# Patient Record
Sex: Male | Born: 1955 | Race: White | Hispanic: No | Marital: Single | Smoking: Former smoker
Health system: Southern US, Community
[De-identification: ages and names within clinical notes are randomized; demographics above are authoritative.]

## PROBLEM LIST (undated history)

## (undated) DIAGNOSIS — K5790 Diverticulosis of intestine, part unspecified, without perforation or abscess without bleeding: Secondary | ICD-10-CM

## (undated) DIAGNOSIS — K603 Anal fistula, unspecified: Secondary | ICD-10-CM

## (undated) DIAGNOSIS — K219 Gastro-esophageal reflux disease without esophagitis: Secondary | ICD-10-CM

## (undated) DIAGNOSIS — G4730 Sleep apnea, unspecified: Secondary | ICD-10-CM

## (undated) DIAGNOSIS — L509 Urticaria, unspecified: Secondary | ICD-10-CM

## (undated) DIAGNOSIS — K649 Unspecified hemorrhoids: Secondary | ICD-10-CM

## (undated) DIAGNOSIS — R569 Unspecified convulsions: Secondary | ICD-10-CM

## (undated) HISTORY — PX: COLONOSCOPY W/ POLYPECTOMY: SHX1380

## (undated) HISTORY — DX: Unspecified convulsions: R56.9

## (undated) HISTORY — PX: NOSE SURGERY: SHX723

## (undated) HISTORY — DX: Sleep apnea, unspecified: G47.30

## (undated) HISTORY — DX: Anal fistula, unspecified: K60.30

## (undated) HISTORY — DX: Anal fistula: K60.3

## (undated) HISTORY — DX: Gastro-esophageal reflux disease without esophagitis: K21.9

## (undated) HISTORY — DX: Unspecified hemorrhoids: K64.9

## (undated) HISTORY — DX: Diverticulosis of intestine, part unspecified, without perforation or abscess without bleeding: K57.90

## (undated) HISTORY — DX: Urticaria, unspecified: L50.9

## (undated) NOTE — OR Surgeon (Signed)
 Formatting of this note is different from the original.  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 Right nasal polyp.  Estimated Blood Loss:    10 ml  Fluids:    Crystalloid: 600 ml  Drains:   none  Complications: * No complications entered in OR log *  Specimens:  Specimen ID Type Site Comments Sent To  x Culture Aerobic  Source: Nasal Septum   x Culture Fungal  Source: Nasal Septum   x Other  AFB stain/culture   [id number] Permanent with Preservative  1) RIGHT NASAL POLYP   [id number] Permanent with Preservative  2) POSTERIOR NASAL SEPTUM   [id number] Permanent with Preservative   3) SUPERIOR NASAL SEPTUM     Condition: stable  Disposition: PACU  Dictation: Yes   Electronically signed by Bonnee Reatha MOHR (M.D.) at 10/23/2011  9:15 AM PDT

## (undated) NOTE — OR PreOp (Signed)
 Formatting of this note is different from the original. PreOperative Telephone History Supervising Physician: Dr. 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 Per progress note by Bonnee Reatha MOHR (M.D.) 09/24/2011 5:55 PM Signed  Chief complaint: Epistaxis.  History of present illness: 48 year old man with severe abscess requiring bilateral packing one week ago. On the patient's packing was removed the following day and Surgicel was placed. The patient was asked to followup with ear nose and throat. The patient also complains of decreased hearing bilaterally since air flight with nasal packing in place.  Diagnostic nasal endoscopy was performed after suctioning some mucoid and bloody drainage from the right nostril. There is inflammation of and probable persistent packing material. There appears to be a possible polypoid mass in the right posterior nasal fossa. This is not clear and may actually be persistent packing material or clots. There are bilateral eschars on the nasal septum consistent with silver nitrate cautery.  Impression plan: Recent epistaxis. It resolved. Possible right nasal mass versus a persistent packing material and clot. Use saline spray. Return to clinic 10 days. A biopsy anything abnormal at that point. A repeat nasal endoscopy at that time. Nasopharyngoscopy at that time as well. Otitis media diffusion is probably reactive to the recent packing material and air flight.  Past Medical History Past Medical History   Diagnosis Date  ? ERECTILE DYSFUNCTION 11/22/2010   Past Surgical History  Procedure Date  ? Negative past surgical hx 10/18/11   Allergies  Allergen Reactions  ? No Known Allergies    Outpatient Prescriptions Marked as Taking for the 10/23/11 encounter [facility] Encounter)  Medication Sig  ? Cyanocobalamin (VITAMIN B-12) 250 mcg Oral Tab Daily   Social History:   History  Smoking status  ? Current Everyday Smoker -- 1.0 packs/day  ? Types: Cigarettes  Smokeless tobacco  ? Never Used  Comment: quit 35 days ago today's date 10/18/11   History  Alcohol Use  ? Yes    Has not drank since 09/15/11   History  Drug Use No   Family History of Anesthesia Complications: None  Personal History of Anesthesia Complications: none  Review of Systems:  General: Denies fever, chills, weight change, fatigue, weakness and but does have the following: season allergies.  Cardiovascular: Denies chest pain, orthopnea, PND and palpitations with exercise or at rest Respiratory: Denies recent SOB, orthopnea, dyspnea and cough (productive) Neuro: Denies fainting, seizures, weakness of extremities, numbness and headaches GI: Denies GERD GU: Denies dysuria, frequency, urgency and history of difficult foley catheter placement. Heme: Denies easy bruising and bleeding Musculoskeletal: Denies joint pain and decreased ROM neck Skin: Denies rashes, abrasions, cuts, skin infection and skin changes over the surgical site Other: none  Physical Exam: To be completed on the day of surgery.  Most recent information from chart review: Estimated Body mass index is 27.98 kg/(m^2) as calculated from the following:   Height as of 09/24/11: 5' 10(1.778 m).   Weight as of 09/24/11: 195 lb(88.451 kg). SpO2 Readings from Last 3 Encounters:  09/24/11 100%  12/18/10 97%  09/25/10 97%   BP Readings from Last 3 Encounters:  09/24/11 121/76  12/18/10 130/85  09/25/10 125/84   Pulse Readings from Last 3  Encounters:  09/24/11 116  12/18/10 72  09/25/10 80   Pre-Operative Screening  Pregnancy Testing:  N/A (male, nonmenstruating male, hx of hysterectomy)  Obstructive Sleep Apnea Criteria: Risk factors or symptoms of sleep apnea as follows:  Snoring  Exercise/Functional Capacity:  7-8 mets: e.g. Heavy housework (scrubbing floors, moving furniture), participate in activities such as golf, bowling, dancing, doubles tennis and Patient's current level of activity includes the following: bikes.   History of Cardiac Stent: No   Objections To Blood Transfusions: NO  Labs:   Lab Results  Component Value Date/Time   WBC 6.3 09/26/2010  7:26 AM   HCT 43.3 09/26/2010  7:26 AM   PLT 236 09/26/2010  7:26 AM   Lab Results  Component Value Date/Time   CR 1.19 09/26/2010  7:26 AM   No results found for this basename: PT, PTT, INR   Lab Results  Component Value Date/Time   TSH 1.50 09/26/2010  7:26 AM   Review of Other Relevant Data: EKG: date 10/18/11, result Other wise normal EKG Sinus rhythm Abnormal R-wave progression, early transition Cardiac rate 94  Echo: none  Cardiac Stress Test: none  Pneumonia Prevention Education: N/A, hospital ambulatory surgery  Impression: [name] is a 29 Y male is prepared for planned surgery.  The following studies have been ordered or are pending at the time of this visit: CT  Patient Instructions: The patient was provided the following PREOPERATIVE instructions:  Prior to the Day of Surgery  Discontinue all herbal supplements and vitamins at least 7 days prior to surgery.  On the Day of Surgery Take only the following meds with small sips of water:nothing.  Patient instructed to wear button up shirt the morning of surgery.  Asberry CANDIE Savory, PA-C   Electronically signed by Bonnee Reatha MOHR (M.D.) at 10/18/2011  3:54 PM PDT

## (undated) NOTE — H&P (Signed)
 Formatting of this note is different from the original. 10/23/2011                 HISTORY AND PHYSICAL EXAMINATION Elijah Robertson  [id number]  RR:wjdjo lesion YEP:Izdumlrupcz nasal lesion. Recent history of epistaxis requiring packing. Septum has been destroyed after bilateral eschar formation. PMH:  Past Medical History  Diagnosis Date  ? ERECTILE DYSFUNCTION 11/22/2010  ? NASAL LESION. 10/23/2011   PSH:   Past Surgical History  Procedure Date  ? Negative past surgical hx 10/18/11    Allergies Allergies  Allergen Reactions  ? No Known Allergies    Medications Current Facility-Administered Medications  Medication  ? Lidocaine  10 mg/mL (1 %) Inj 1 mg (Xylocaine )  ? Lactated Ringers  IV Premix  ? Midazolam  Inj 1 mg (VERSED )  ? Clindamycin in D5W IV Premix 900 mg (CLEOCIN)   No current outpatient prescriptions on file.   Social History History  Smoking status  ? Current Everyday Smoker -- 1.0 packs/day  ? Types: Cigarettes  Smokeless tobacco  ? Never Used  Comment: quit 35 days ago today's date 10/18/11   History  Alcohol Use  ? Yes    Has not drank since 09/15/11   History  Drug Use No   Family History: No problems with GA or bleeding disorders.  Review of Systems: Neuro: no symptoms Cardiovascular: No chest pain, shortness of breath. Respiratory: No cough, wheezing. GI: No abdominal pain, nausea, vomiting, diarrhea, melena, hematochezia. GU: NO dysuria. No back pain  BP 117/82  Pulse 73  Temp 97.8 F (36.6 C)  Resp 20  Ht 5' 10  Wt 195 lb (88.451 kg)  BMI 27.98 kg/m2  SpO2 99% General: Well-developed, well nourished male appearing stated age. Neuro: CN II-XII grossly intact and symmetric. No gross sensory or strength deficits. HEENT: Normocephalic. TM's intact and mobile. Normal  oc/op exam. Crusting and devitalized tissue in nose. Neck: supple without adenopathy, thyromegaly.  Chest: Breath sounds equal and clear to auscultation. Cor: RRR  without murmur, gallop, rub. Abdomen: soft, nontender, nondistended. no hepatoslenomegaly. No fluid. Extremities: No clubbing, cyanosis, edema. Good peripheral pulses. Warm, dry. Skin: NO lesions. Back: No CVA tenderness. GU/Rectal: not performed. Breasts: not performed.  Impression/Plan:Destructive process in nose. Offered debridement and biopsy. We discussed the risks, benefits, and alternatives to the procedure and plan of care including potential side effects and complications, both of which may be severe and require additional surgery and/or procedures to treat this.  We discussed the risks and benefits of alternative treatment options and the likelihood of achieving the desired outcome with surgery.  The patient was given the opportunity to ask questions and to acknowledge understanding of the procedure and any potential problems that might occur during recuperation.  BASIL W. ADAD MD   Electronically signed by Bonnee Reatha MOHR (M.D.) at 10/23/2011  7:41 AM PDT

## (undated) NOTE — Progress Notes (Signed)
 Formatting of this note might be different from the original. Ordered anca to be obtained today. Plan on postoperative diflucan  with all results pending and continued destructive process. Electronically signed by Bonnee Reatha MOHR (M.D.) at 10/23/2011  7:50 AM PDT

## (undated) NOTE — Nursing Note (Signed)
 Formatting of this note might be different from the original. PreOp Nursing Note  Handoff: Handoff completed to OR RN. The following are on the chart (H&P, Consent, Interval, RBA)   Medications/Orders: All preop medications administered as ordered.  SCDs  on patient as ordered.  Pain: Patient and/or family educated on pain scale, expected pain and pain management strategies.  Fall Assessment: All perioperative patients are considered high risk for fall related to sedation & anesthesia.  Exceptions: Listed below are exceptions to above: pt admitted for nasal surgery. Talked to doctor and anesthesia. All questions answered, all procedures explained. Pt appears calm. Sister at bedside visiting. Ready for surgery. Well prepared.  Electronically signed by Frizzell, Patrice E (R.N.) at 10/23/2011  7:48 AM PDT

## (undated) NOTE — Discharge Summary (Signed)
 Formatting of this note is different from the original.  Discharge Note - [facility] / [facility]  Procedure Performed: Procedure(s) (LRB): NASAL INTERNAL BIOPSY OF LESION (Bilateral)  Date of Procedure: 10/23/2011  Hospital Course:  No Complications  Discharge Instructions:    Entered in [facility]  Discharge Medications:  Current Discharge Medication List   START taking these medications   Details  HYDROcodone-Acetaminophen  (NORCO) 10-325 mg Oral Tab TAKE 1 TABLET ORALLY EVERY 4 HOURS AS NEEDED FOR PAIN   Fluconazole  (DIFLUCAN ) 100 mg Oral Tab Take 2 tablets per day and one tablet daily by mouth    CONTINUE these medications which have NOT CHANGED   Details  Cyanocobalamin (VITAMIN B-12) 250 mcg Oral Tab Daily   HYDROcodone-Acetaminophen  (NORCO) 5-325 mg Oral Tab Take 1 to 2 tablets orally every 4 to 6 hours when needed for pain     Condition on Discharge:   Currently in PACU.  Will be discharged if stable and discharge criteria met.   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

---
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
## 2007-06-12 DIAGNOSIS — Z87891 Personal history of nicotine dependence: Secondary | ICD-10-CM | POA: Insufficient documentation

## 2011-12-10 DIAGNOSIS — J3489 Other specified disorders of nose and nasal sinuses: Secondary | ICD-10-CM | POA: Insufficient documentation

## 2013-12-01 DIAGNOSIS — Z9889 Other specified postprocedural states: Secondary | ICD-10-CM | POA: Insufficient documentation

## 2013-12-01 DIAGNOSIS — Z86010 Personal history of colonic polyps: Secondary | ICD-10-CM | POA: Insufficient documentation

## 2014-01-14 DIAGNOSIS — K649 Unspecified hemorrhoids: Secondary | ICD-10-CM | POA: Insufficient documentation

## 2015-11-13 DIAGNOSIS — Z860100 Personal history of colon polyps, unspecified: Secondary | ICD-10-CM | POA: Insufficient documentation

## 2016-09-11 ENCOUNTER — Encounter: Payer: Self-pay | Admitting: Family

## 2016-09-11 ENCOUNTER — Ambulatory Visit (INDEPENDENT_AMBULATORY_CARE_PROVIDER_SITE_OTHER): Payer: Managed Care, Other (non HMO) | Admitting: Family

## 2016-09-11 VITALS — BP 162/88 | HR 92 | Temp 97.6°F | Resp 16 | Ht 69.0 in | Wt 215.0 lb

## 2016-09-11 DIAGNOSIS — B353 Tinea pedis: Secondary | ICD-10-CM | POA: Insufficient documentation

## 2016-09-11 DIAGNOSIS — K635 Polyp of colon: Secondary | ICD-10-CM | POA: Diagnosis not present

## 2016-09-11 DIAGNOSIS — R0683 Snoring: Secondary | ICD-10-CM

## 2016-09-11 DIAGNOSIS — E6609 Other obesity due to excess calories: Secondary | ICD-10-CM

## 2016-09-11 DIAGNOSIS — N521 Erectile dysfunction due to diseases classified elsewhere: Secondary | ICD-10-CM

## 2016-09-11 DIAGNOSIS — R030 Elevated blood-pressure reading, without diagnosis of hypertension: Secondary | ICD-10-CM

## 2016-09-11 DIAGNOSIS — E669 Obesity, unspecified: Secondary | ICD-10-CM | POA: Insufficient documentation

## 2016-09-11 DIAGNOSIS — Z6831 Body mass index (BMI) 31.0-31.9, adult: Secondary | ICD-10-CM

## 2016-09-11 DIAGNOSIS — L740 Miliaria rubra: Secondary | ICD-10-CM

## 2016-09-11 MED ORDER — KETOCONAZOLE 2 % EX CREA: 1.0000 "application " | TOPICAL_CREAM | Freq: Every day | CUTANEOUS | 2 refills | Status: DC

## 2016-09-11 MED ORDER — FLUCONAZOLE 150 MG PO TABS: ORAL_TABLET | ORAL | 0 refills | Status: DC

## 2016-09-11 MED ORDER — SILDENAFIL CITRATE 20 MG PO TABS: ORAL_TABLET | ORAL | 1 refills | Status: DC

## 2016-09-11 NOTE — Assessment & Plan Note (Signed)
Elevated blood pressure reading with no significant history of hypertension. Encouraged to monitor blood pressure at home and follow-up in one month for blood pressure checks. Recommended low-sodium diet and increasing physical activity. Denies worst headache of life with no symptoms of end organ damage noted on physical exam.

## 2016-09-11 NOTE — Assessment & Plan Note (Signed)
No significant findings on physical exam was snoring with concern for possible sleep apnea with Epworth Sleepiness Scale of 8. Refer to neurology/sleep medicine for possible sleep study. Consider trial of Breathe Right strips with recommendation of weight loss to help with symptom reduction.

## 2016-09-11 NOTE — Progress Notes (Signed)
Subjective:    Patient ID: Elijah Robertson, male    DOB: 1955/08/11, 61 y.o.   MRN: [id number]  Chief Complaint  Patient presents with  . Establish Care    rashes that break out only when he sweats or works out that he notices, they come and go for years    HPI:  Elijah Robertson is a 61 y.o. male who  has a past medical history of Hemorrhoids and Seizures ([place]). and presents today for an office visit to establish.  1.)  Rashes - This is a new problem. Associated symptoms of a rash located in several areas including feet, chest and head. Described as red. Notes that they get worse with sweating and tends to improve slightly when he cools down. Has noted some spreading since initial onset. This has been waxing and waning over the course of the past couple of years.    2.) Colon cancer screening - Completed colonscopy about 3 years ago and was recommended to follow up in 3 years for additional screening secondary to colon polyps that he believes were to benign. Newly moved to the area and due for colonoscopy. He does have hemorrhoids, but otherwise denies melena, constipation or diarrhea.   3.) Erectile dysfunction - Currently maintained on sildenafil. Reports taking the medication as prescribed and denies adverse side effects or priapism.     4.) Snorning - This is a new problem. Associated symptoms of snorning has been going on for several years. Denies witnessned periods of apena. Does feel well rested in the morning with occasional headache that gets better over the course of the day. Denies hypersolmulence throughout the day. Modifying factors include working out and attempting to lose weight.   No Known Allergies    No outpatient prescriptions prior to visit.   No facility-administered medications prior to visit.      Past Medical History:  Diagnosis Date  . Hemorrhoids   . Seizures ([place])    Childhood - none in adulthood      Past Surgical History:  Procedure  Laterality Date  . NOSE SURGERY     Septal surgery      Family History  Problem Relation Age of Onset  . Breast cancer Mother   . Healthy Maternal Grandmother   . Healthy Maternal Grandfather   . Healthy Paternal Grandmother       Social History   Social History  . Marital status: Single    Spouse name: N/A  . Number of children: N/A  . Years of education: N/A   Occupational History  . Not on file.   Social History Main Topics  . Smoking status: Former Research scientist (life sciences)  . Smokeless tobacco: Never Used  . Alcohol use Yes     Comment: several drinks per week.   . Drug use: Yes    Types: Marijuana     Comment: 1x every couple of months  . Sexual activity: Yes   Other Topics Concern  . Not on file   Social History Narrative  . No narrative on file      Review of Systems  Constitutional: Negative for chills and fever.  Respiratory: Negative for chest tightness, shortness of breath and wheezing.   Cardiovascular: Negative for chest pain, palpitations and leg swelling.  Genitourinary:       Positive for erectile dysfunction  Skin: Positive for rash.  Psychiatric/Behavioral: Negative for sleep disturbance.       Objective:    BP (!) 162/88 (BP Location:  Left Arm, Patient Position: Sitting, Cuff Size: Large)   Pulse 92   Temp 97.6 F (36.4 C) (Oral)   Resp 16   Ht 5\' 9"  (1.753 m)   Wt 215 lb (97.5 kg)   SpO2 95%   BMI 31.75 kg/m  Nursing note and vital signs reviewed.  Physical Exam  Constitutional: He is oriented to person, place, and time. He appears well-developed and well-nourished. No distress.  HENT:  Nose: Nose normal. No mucosal edema, rhinorrhea, nose lacerations, sinus tenderness, nasal deformity, septal deviation or nasal septal hematoma. No epistaxis.  No foreign bodies.  Cardiovascular: Normal rate, regular rhythm, normal heart sounds and intact distal pulses.   Pulmonary/Chest: Effort normal and breath sounds normal.  Neurological: He is alert  and oriented to person, place, and time.  Skin: Skin is warm and dry.  Right foot with red rash located between second and third and third and fourth toes with mild skin breakdown. No obvious odor or discharge present.  Head/chest - no significant rash noted at this time.  Psychiatric: He has a normal mood and affect. His behavior is normal. Judgment and thought content normal.        Assessment & Plan:   Problem List Items Addressed This Visit      Digestive   Polyp of colon    Previously diagnosed with polyps during his most recent colonoscopy approximately 3 years ago with recommendation for follow-up colonoscopy in 3 years. Referral to gastroenterology placed for colonoscopy follow-up.      Relevant Orders   Ambulatory referral to Gastroenterology     Musculoskeletal and Integument   Heat rash - Primary    Rash description is consistent with possible heat rash most likely related to sweating. Recommend cotton clothing and over-the-counter hydrocortisone cream as needed. If symptoms worsen or do not improve consider referral to dermatology.      Tinea pedis of right foot    Symptoms and exam consistent with tinea pedis of the right foot. Continue current dosage of ketoconazole and start fluconazole. Prevention care discussed. Follow-up if symptoms worsen or do not improve.      Relevant Medications   ketoconazole (NIZORAL) 2 % cream   fluconazole (DIFLUCAN) 150 MG tablet     Other   Erectile dysfunction due to diseases classified elsewhere    Stable and currently maintained on sildenafil with no adverse side effects or priapism. Continue current dosage of sildenafil. Continue to monitor.      Snoring    No significant findings on physical exam was snoring with concern for possible sleep apnea with Epworth Sleepiness Scale of 8. Refer to neurology/sleep medicine for possible sleep study. Consider trial of Breathe Right strips with recommendation of weight loss to help with  symptom reduction.      Relevant Orders   Ambulatory referral to Neurology   Elevated blood pressure reading    Elevated blood pressure reading with no significant history of hypertension. Encouraged to monitor blood pressure at home and follow-up in one month for blood pressure checks. Recommended low-sodium diet and increasing physical activity. Denies worst headache of life with no symptoms of end organ damage noted on physical exam.      Obesity    BMI of 31.75. Recommend weight loss of 5-10% of current body weight. Recommend increasing physical activity to 30 minutes of moderate level activity daily. Encourage nutritional intake that focuses on nutrient dense foods and is moderate, varied, and balanced and is low in saturated  fats and processed/sugary foods. Continue to monitor.            I have changed Elijah Robertson's sildenafil. I am also having him start on fluconazole. Additionally, I am having him maintain his Zinc Sulfate (ZINC 15 PO), acetaminophen, and ketoconazole.   Meds ordered this encounter  Medications  . DISCONTD: sildenafil (REVATIO) 20 MG tablet    Sig: Take 20 mg by mouth 3 (three) times daily.  Marland Kitchen DISCONTD: ketoconazole (NIZORAL) 2 % cream    Sig: Apply 1 application topically daily.  . Zinc Sulfate (ZINC 15 PO)    Sig: Take by mouth.  Marland Kitchen acetaminophen (TYLENOL) 325 MG tablet    Sig: Take 650 mg by mouth every 6 (six) hours as needed.  Marland Kitchen ketoconazole (NIZORAL) 2 % cream    Sig: Apply 1 application topically daily.    Dispense:  15 g    Refill:  2    Order Specific Question:   Supervising Provider    Answer:   Pricilla Holm A [id number]  . sildenafil (REVATIO) 20 MG tablet    Sig: Take 1-5 tablets by mouth daily as needed for erectile dysfunction.    Dispense:  50 tablet    Refill:  1    Order Specific Question:   Supervising Provider    Answer:   Pricilla Holm A [id number]  . fluconazole (DIFLUCAN) 150 MG tablet    Sig: Take 1 tablet by mouth  weekly.    Dispense:  2 tablet    Refill:  0    Order Specific Question:   Supervising Provider    Answer:   Pricilla Holm A [id number]     Follow-up: Return in about 1 month (around 10/09/2016), or if symptoms worsen or fail to improve.  Mauricio Po, FNP

## 2016-09-11 NOTE — Assessment & Plan Note (Signed)
Stable and currently maintained on sildenafil with no adverse side effects or priapism. Continue current dosage of sildenafil. Continue to monitor.

## 2016-09-11 NOTE — Assessment & Plan Note (Signed)
Symptoms and exam consistent with tinea pedis of the right foot. Continue current dosage of ketoconazole and start fluconazole. Prevention care discussed. Follow-up if symptoms worsen or do not improve.

## 2016-09-11 NOTE — Assessment & Plan Note (Signed)
Rash description is consistent with possible heat rash most likely related to sweating. Recommend cotton clothing and over-the-counter hydrocortisone cream as needed. If symptoms worsen or do not improve consider referral to dermatology.

## 2016-09-11 NOTE — Assessment & Plan Note (Signed)
BMI of 31.75. Recommend weight loss of 5-10% of current body weight. Recommend increasing physical activity to 30 minutes of moderate level activity daily. Encourage nutritional intake that focuses on nutrient dense foods and is moderate, varied, and balanced and is low in saturated fats and processed/sugary foods. Continue to monitor.

## 2016-09-11 NOTE — Assessment & Plan Note (Signed)
Previously diagnosed with polyps during his most recent colonoscopy approximately 3 years ago with recommendation for follow-up colonoscopy in 3 years. Referral to gastroenterology placed for colonoscopy follow-up.

## 2016-09-11 NOTE — Patient Instructions (Addendum)
Thank you for choosing [facility].  SUMMARY AND INSTRUCTIONS:  Wear cotton type clothing.  To reduce sweating, consider an antiperspirant to the feet (white solid not gel or spray)  Consider a antifungal powder as needed such as Goldbond.  Medication:  Your prescription(s) have been submitted to your pharmacy or been printed and provided for you. Please take as directed and contact our office if you believe you are having problem(s) with the medication(s) or have any questions.  Referrals:  Referrals have been made during this visit. You should expect to hear back from our schedulers in about 7-10 days in regards to establishing an appointment with the specialists we discussed.   Follow up:  If your symptoms worsen or fail to improve, please contact our office for further instruction, or in case of emergency go directly to the emergency room at the closest medical facility.

## 2016-09-26 ENCOUNTER — Encounter: Payer: Self-pay | Admitting: Gastroenterology

## 2016-10-28 ENCOUNTER — Encounter: Payer: Self-pay | Admitting: Family

## 2016-10-28 ENCOUNTER — Other Ambulatory Visit (INDEPENDENT_AMBULATORY_CARE_PROVIDER_SITE_OTHER): Payer: 59

## 2016-10-28 ENCOUNTER — Ambulatory Visit (INDEPENDENT_AMBULATORY_CARE_PROVIDER_SITE_OTHER): Payer: 59 | Admitting: Family

## 2016-10-28 VITALS — BP 140/84 | HR 78 | Temp 98.1°F | Resp 16 | Ht 69.0 in | Wt 212.0 lb

## 2016-10-28 DIAGNOSIS — Z6831 Body mass index (BMI) 31.0-31.9, adult: Secondary | ICD-10-CM | POA: Diagnosis not present

## 2016-10-28 DIAGNOSIS — Z0000 Encounter for general adult medical examination without abnormal findings: Secondary | ICD-10-CM

## 2016-10-28 DIAGNOSIS — Z7289 Other problems related to lifestyle: Secondary | ICD-10-CM

## 2016-10-28 DIAGNOSIS — E6609 Other obesity due to excess calories: Secondary | ICD-10-CM

## 2016-10-28 LAB — LIPID PANEL
Cholesterol: 221 mg/dL — ABNORMAL HIGH (ref 0–200)
HDL: 48.6 mg/dL (ref >39.00)
LDL CALC: 136 mg/dL — AB (ref 0–99)
NonHDL: 172.21
Total CHOL/HDL Ratio: 5
Triglycerides: 183 mg/dL — ABNORMAL HIGH (ref 0.0–149.0)
VLDL: 36.6 mg/dL (ref 0.0–40.0)

## 2016-10-28 LAB — COMPREHENSIVE METABOLIC PANEL
ALBUMIN: 4.2 g/dL (ref 3.5–5.2)
ALK PHOS: 53 U/L (ref 39–117)
ALT: 45 U/L (ref 0–53)
AST: 55 U/L — AB (ref 0–37)
BUN: 22 mg/dL (ref 6–23)
CHLORIDE: 106 meq/L (ref 96–112)
CO2: 29 mEq/L (ref 19–32)
CREATININE: 1.13 mg/dL (ref 0.40–1.50)
Calcium: 9.7 mg/dL (ref 8.4–10.5)
GFR: 70.66 mL/min (ref >60.00)
Glucose, Bld: 99 mg/dL (ref 70–99)
Potassium: 4.9 mEq/L (ref 3.5–5.1)
SODIUM: 140 meq/L (ref 135–145)
TOTAL PROTEIN: 6.9 g/dL (ref 6.0–8.3)
Total Bilirubin: 0.3 mg/dL (ref 0.2–1.2)

## 2016-10-28 LAB — CBC
HCT: 44.4 % (ref 39.0–52.0)
Hemoglobin: 15.2 g/dL (ref 13.0–17.0)
MCHC: 34.2 g/dL (ref 30.0–36.0)
MCV: 92.5 fl (ref 78.0–100.0)
Platelets: 259 10*3/uL (ref 150.0–400.0)
RBC: 4.79 Mil/uL (ref 4.22–5.81)
RDW: 13.2 % (ref 11.5–15.5)
WBC: 5.7 10*3/uL (ref 4.0–10.5)

## 2016-10-28 LAB — PSA: PSA: 3.47 ng/mL (ref 0.10–4.00)

## 2016-10-28 NOTE — Assessment & Plan Note (Signed)
BMI of 31. Recommend weight loss of 5-10% of current body weight. Recommend increasing physical activity to 30 minutes of moderate level activity daily. Encourage nutritional intake that focuses on nutrient dense foods and is moderate, varied, and balanced and is low in saturated fats and processed/sugary foods. Continue to monitor.

## 2016-10-28 NOTE — Patient Instructions (Addendum)
Thank you for choosing [facility].  SUMMARY AND INSTRUCTIONS:  Debrox or Murine for cleaning your ears available over the counter.   Use MyFitnessPal to track your calories or other software/application.  Goal to lose about 5-10% of your current body weight.   They will call to schedule your appointment for dermatology.  Labs:  Please stop by the lab on the lower level of the building for your blood work. Your results will be released to [facility] (or called to you) after review, usually within 72 hours after test completion. If any changes need to be made, you will be notified at that same time.  1.) The lab is open from 7:30am to 5:30 pm Monday-Friday 2.) No appointment is necessary 3.) Fasting (if needed) is 6-8 hours after food and drink; black coffee and water are okay    Follow up:  If your symptoms worsen or fail to improve, please contact our office for further instruction, or in case of emergency go directly to the emergency room at the closest medical facility.    Health Maintenance, Male A healthy lifestyle and preventive care is important for your health and wellness. Ask your health care provider about what schedule of regular examinations is right for you. What should I know about weight and diet?  Eat a Healthy Diet  Eat plenty of vegetables, fruits, whole grains, low-fat dairy products, and lean protein.  Do not eat a lot of foods high in solid fats, added sugars, or salt. Maintain a Healthy Weight  Regular exercise can help you achieve or maintain a healthy weight. You should:  Do at least 150 minutes of exercise each week. The exercise should increase your heart rate and make you sweat (moderate-intensity exercise).  Do strength-training exercises at least twice a week. Watch Your Levels of Cholesterol and Blood Lipids  Have your blood tested for lipids and cholesterol every 5 years starting at 59 years of age. If you are at high risk for heart  disease, you should start having your blood tested when you are 59 years old. You may need to have your cholesterol levels checked more often if:  Your lipid or cholesterol levels are high.  You are older than 59 years of age.  You are at high risk for heart disease. What should I know about cancer screening? Many types of cancers can be detected early and may often be prevented. Lung Cancer  You should be screened every year for lung cancer if:  You are a current smoker who has smoked for at least 30 years.  You are a former smoker who has quit within the past 15 years.  Talk to your health care provider about your screening options, when you should start screening, and how often you should be screened. Colorectal Cancer  Routine colorectal cancer screening usually begins at 59 years of age and should be repeated every 5-10 years until you are 59 years old. You may need to be screened more often if early forms of precancerous polyps or small growths are found. Your health care provider may recommend screening at an earlier age if you have risk factors for colon cancer.  Your health care provider may recommend using home test kits to check for hidden blood in the stool.  A small camera at the end of a tube can be used to examine your colon (sigmoidoscopy or colonoscopy). This checks for the earliest forms of colorectal cancer. Prostate and Testicular Cancer  Depending on your age  and overall health, your health care provider may do certain tests to screen for prostate and testicular cancer.  Talk to your health care provider about any symptoms or concerns you have about testicular or prostate cancer. Skin Cancer  Check your skin from head to toe regularly.  Tell your health care provider about any new moles or changes in moles, especially if:  There is a change in a mole's size, shape, or color.  You have a mole that is larger than a pencil eraser.  Always use sunscreen. Apply  sunscreen liberally and repeat throughout the day.  Protect yourself by wearing long sleeves, pants, a wide-brimmed hat, and sunglasses when outside. What should I know about heart disease, diabetes, and high blood pressure?  If you are 51-76 years of age, have your blood pressure checked every 3-5 years. If you are 67 years of age or older, have your blood pressure checked every year. You should have your blood pressure measured twice-once when you are at a hospital or clinic, and once when you are not at a hospital or clinic. Record the average of the two measurements. To check your blood pressure when you are not at a hospital or clinic, you can use:  An automated blood pressure machine at a pharmacy.  A home blood pressure monitor.  Talk to your health care provider about your target blood pressure.  If you are between 1-59 years old, ask your health care provider if you should take aspirin to prevent heart disease.  Have regular diabetes screenings by checking your fasting blood sugar level.  If you are at a normal weight and have a low risk for diabetes, have this test once every three years after the age of 69.  If you are overweight and have a high risk for diabetes, consider being tested at a younger age or more often.  A one-time screening for abdominal aortic aneurysm (AAA) by ultrasound is recommended for men aged 65-75 years who are current or former smokers. What should I know about preventing infection? Hepatitis B  If you have a higher risk for hepatitis B, you should be screened for this virus. Talk with your health care provider to find out if you are at risk for hepatitis B infection. Hepatitis C  Blood testing is recommended for:  Everyone born from [birth year] through 1963.  Anyone with known risk factors for hepatitis C. Sexually Transmitted Diseases (STDs)  You should be screened each year for STDs including gonorrhea and chlamydia if:  You are sexually active and  are younger than 59 years of age.  You are older than 59 years of age and your health care provider tells you that you are at risk for this type of infection.  Your sexual activity has changed since you were last screened and you are at an increased risk for chlamydia or gonorrhea. Ask your health care provider if you are at risk.  Talk with your health care provider about whether you are at high risk of being infected with HIV. Your health care provider may recommend a prescription medicine to help prevent HIV infection. What else can I do?  Schedule regular health, dental, and eye exams.  Stay current with your vaccines (immunizations).  Do not use any tobacco products, such as cigarettes, chewing tobacco, and e-cigarettes. If you need help quitting, ask your health care provider.  Limit alcohol intake to no more than 2 drinks per day. One drink equals 12 ounces of beer, 5  ounces of wine, or 1 ounces of hard liquor.  Do not use street drugs.  Do not share needles.  Ask your health care provider for help if you need support or information about quitting drugs.  Tell your health care provider if you often feel depressed.  Tell your health care provider if you have ever been abused or do not feel safe at home. This information is not intended to replace advice given to you by your health care provider. Make sure you discuss any questions you have with your health care provider. Document Released: 01/18/2008 Document Revised: 03/20/2016 Document Reviewed: 04/25/2015 Elsevier Interactive Patient Education  2017 [employer].

## 2016-10-28 NOTE — Progress Notes (Signed)
Subjective:    Patient ID: Elijah Robertson, male    DOB: 05-01-1956, 61 y.o.   MRN: [id number]  Chief Complaint  Patient presents with  . CPE    fasting    HPI:  Elijah Robertson is a 61 y.o. male who presents today for an annual wellness visit.   1) Health Maintenance -   Diet - Averaging about 3 meals per day consisting of a regular diet; Caffeine intake of 3-4 cups daily  Exercise - 4-5x per week consisting of cardio and resistance training.    2) Preventative Exams / Immunizations:  Dental -- Up to date  Vision -- Up to date   Health Maintenance  Topic Date Due  . Hepatitis C Screening  09-26-1955  . HIV Screening  01/28/1973  . COLONOSCOPY  08/06/2023  . TETANUS/TDAP  09/05/2024  . INFLUENZA VACCINE  Completed     There is no immunization history on file for this patient.   No Known Allergies   Outpatient Medications Prior to Visit  Medication Sig Dispense Refill  . acetaminophen (TYLENOL) 325 MG tablet Take 650 mg by mouth every 6 (six) hours as needed.    . fluconazole (DIFLUCAN) 150 MG tablet Take 1 tablet by mouth weekly. 2 tablet 0  . ketoconazole (NIZORAL) 2 % cream Apply 1 application topically daily. 15 g 2  . sildenafil (REVATIO) 20 MG tablet Take 1-5 tablets by mouth daily as needed for erectile dysfunction. 50 tablet 1  . Zinc Sulfate (ZINC 15 PO) Take by mouth.     No facility-administered medications prior to visit.      Past Medical History:  Diagnosis Date  . Hemorrhoids   . Seizures ([place])    Childhood - none in adulthood     Past Surgical History:  Procedure Laterality Date  . NOSE SURGERY     Septal surgery     Family History  Problem Relation Age of Onset  . Breast cancer Mother   . Healthy Maternal Grandmother   . Healthy Maternal Grandfather   . Healthy Paternal Grandmother      Social History   Social History  . Marital status: Single    Spouse name: N/A  . Number of children: 3  . Years of education: 28    Occupational History  . Not on file.   Social History Main Topics  . Smoking status: Former Smoker    Packs/day: 1.00    Years: 25.00  . Smokeless tobacco: Never Used  . Alcohol use 1.2 - 1.8 oz/week    2 - 3 Glasses of wine per week  . Drug use: Yes    Types: Marijuana     Comment: 1x every couple of months  . Sexual activity: Yes   Other Topics Concern  . Not on file   Social History Narrative   Fun/Hobby: Engineer, civil (consulting) sports, hang out with friends; Doctor, general practice; Travel      Review of Systems  Constitutional: Denies fever, chills, fatigue, or significant weight gain/loss. HENT: Head: Denies headache or neck pain Ears: Denies changes in hearing, ringing in ears, earache, drainage Nose: Denies discharge, stuffiness, itching, nosebleed, sinus pain Throat: Denies sore throat, hoarseness, dry mouth, sores, thrush Eyes: Denies loss/changes in vision, pain, redness, blurry/double vision, flashing lights Cardiovascular: Denies chest pain/discomfort, tightness, palpitations, shortness of breath with activity, difficulty lying down, swelling, sudden awakening with shortness of breath Respiratory: Denies shortness of breath, cough, sputum production, wheezing Gastrointestinal: Denies dysphasia, heartburn, change in appetite,  nausea, change in bowel habits, rectal bleeding, constipation, diarrhea, yellow skin or eyes Genitourinary: Denies frequency, urgency, burning/pain, blood in urine, incontinence, change in urinary strength. Musculoskeletal: Denies muscle/joint pain, stiffness, back pain, redness or swelling of joints, trauma Skin: Denies rashes, lumps, itching, dryness, color changes, or hair/nail changes Neurological: Denies dizziness, fainting, seizures, weakness, numbness, tingling, tremor Psychiatric - Denies nervousness, stress, depression or memory loss Endocrine: Denies heat or cold intolerance, sweating, frequent urination, excessive thirst, changes in  appetite Hematologic: Denies ease of bruising or bleeding     Objective:     BP 140/84 (BP Location: Left Arm, Patient Position: Sitting, Cuff Size: Large)   Pulse 78   Temp 98.1 F (36.7 C) (Oral)   Resp 16   Ht 5\' 9"  (1.753 m)   Wt 212 lb (96.2 kg)   SpO2 97%   BMI 31.31 kg/m  Nursing note and vital signs reviewed.   Physical Exam  Constitutional: He is oriented to person, place, and time. He appears well-developed and well-nourished.  HENT:  Head: Normocephalic.  Right Ear: Hearing, tympanic membrane, external ear and ear canal normal.  Left Ear: Hearing, tympanic membrane, external ear and ear canal normal.  Nose: Nose normal.  Mouth/Throat: Uvula is midline, oropharynx is clear and moist and mucous membranes are normal.  Eyes: Conjunctivae and EOM are normal. Pupils are equal, round, and reactive to light.  Neck: Neck supple. No JVD present. No tracheal deviation present. No thyromegaly present.  Cardiovascular: Normal rate, regular rhythm, normal heart sounds and intact distal pulses.   Pulmonary/Chest: Effort normal and breath sounds normal.  Abdominal: Soft. Bowel sounds are normal. He exhibits no distension and no mass. There is no tenderness. There is no rebound and no guarding.  Musculoskeletal: Normal range of motion. He exhibits no edema or tenderness.  Lymphadenopathy:    He has no cervical adenopathy.  Neurological: He is alert and oriented to person, place, and time. He has normal reflexes. No cranial nerve deficit. He exhibits normal muscle tone. Coordination normal.  Skin: Skin is warm and dry.  Psychiatric: He has a normal mood and affect. His behavior is normal. Judgment and thought content normal.       Assessment & Plan:   Problem List Items Addressed This Visit      Other   Obesity    BMI of 31. Recommend weight loss of 5-10% of current body weight. Recommend increasing physical activity to 30 minutes of moderate level activity daily. Encourage  nutritional intake that focuses on nutrient dense foods and is moderate, varied, and balanced and is low in saturated fats and processed/sugary foods. Continue to monitor.        Routine health maintenance - Primary    1) Anticipatory Guidance: Discussed importance of wearing a seatbelt while driving and not texting while driving; changing batteries in smoke detector at least once annually; wearing suntan lotion when outside; eating a balanced and moderate diet; getting physical activity at least 30 minutes per day.  2) Immunizations / Screenings / Labs:  All immunizations are up-to-date per recommendations. Obtain PSA for prostate cancer screening. Obtain hepatitis C antibody for hepatitis C screening. Patient has scheduled appointment for potential colonoscopy for colon cancer screening. All other screenings are up-to-date per recommendations. Obtain CBC, CMET, and lipid profile.    Overall well exam with risk factors for cardiovascular disease including obesity. Recommend weight loss of 5-10% of current body weight through nutrition and physical activity. Blood pressure improved today from  previous. Discussed importance of a nutritional intake that is moderate, balance, and varied. He exercises regularly currently. Continue other healthy lifestyle behaviors and choices. Follow-up prevention exam in 1 year. Follow-up office visit for chronic conditions pending blood work.       Relevant Orders   CBC   Comprehensive metabolic panel   Lipid panel   PSA   Ambulatory referral to Dermatology    Other Visit Diagnoses    Other problems related to lifestyle       Relevant Orders   Hepatitis C antibody       I am having Elijah Robertson maintain his Zinc Sulfate (ZINC 15 PO), acetaminophen, ketoconazole, sildenafil, and fluconazole.   Follow-up: Return in about 6 months (around 04/30/2017), or if symptoms worsen or fail to improve.   Mauricio Po, FNP

## 2016-10-28 NOTE — Assessment & Plan Note (Signed)
1) Anticipatory Guidance: Discussed importance of wearing a seatbelt while driving and not texting while driving; changing batteries in smoke detector at least once annually; wearing suntan lotion when outside; eating a balanced and moderate diet; getting physical activity at least 30 minutes per day.  2) Immunizations / Screenings / Labs:  All immunizations are up-to-date per recommendations. Obtain PSA for prostate cancer screening. Obtain hepatitis C antibody for hepatitis C screening. Patient has scheduled appointment for potential colonoscopy for colon cancer screening. All other screenings are up-to-date per recommendations. Obtain CBC, CMET, and lipid profile.    Overall well exam with risk factors for cardiovascular disease including obesity. Recommend weight loss of 5-10% of current body weight through nutrition and physical activity. Blood pressure improved today from previous. Discussed importance of a nutritional intake that is moderate, balance, and varied. He exercises regularly currently. Continue other healthy lifestyle behaviors and choices. Follow-up prevention exam in 1 year. Follow-up office visit for chronic conditions pending blood work.

## 2016-10-29 ENCOUNTER — Encounter: Payer: Self-pay | Admitting: Family

## 2016-10-29 LAB — HEPATITIS C ANTIBODY: HCV Ab: NEGATIVE

## 2016-11-05 ENCOUNTER — Ambulatory Visit (INDEPENDENT_AMBULATORY_CARE_PROVIDER_SITE_OTHER): Payer: 59 | Admitting: Gastroenterology

## 2016-11-05 ENCOUNTER — Encounter: Payer: Self-pay | Admitting: Gastroenterology

## 2016-11-05 VITALS — BP 152/90 | HR 88 | Resp 16 | Ht 69.0 in | Wt 216.0 lb

## 2016-11-05 DIAGNOSIS — Z86010 Personal history of colonic polyps: Secondary | ICD-10-CM | POA: Diagnosis not present

## 2016-11-05 DIAGNOSIS — R748 Abnormal levels of other serum enzymes: Secondary | ICD-10-CM | POA: Diagnosis not present

## 2016-11-05 DIAGNOSIS — K649 Unspecified hemorrhoids: Secondary | ICD-10-CM | POA: Diagnosis not present

## 2016-11-05 NOTE — Progress Notes (Signed)
HPI :  61 y/o male with a reported history of hemorrhoids, seizure disorder as child, and OSA, here for a new patient evaluation to discuss having a colonoscopy.   He reports he had a colonoscopy done 3 years ago, he had some polyps removed and told to return in 3 years. He does not know the specific results but the procedure was done in [facility], by a Dr. Morey Hummingbird.  No known FH of colon cancer. He has hemorrhoids which bother him, and had banding x 2. He reports he has some intermittent chronic irritation from his hemorrhoids which bothers him. Prior banding he did not think  helped him too much but had only 2 bands placed total, unclear what system was used for banding. No trouble with diarrhea or constipation. He denies straining.  Childhood seizure disorder but none since adulthood. No trouble with anesthesia in the past.   Incidentally noted to have mild AST / ALT to 55, and 45. No prior values for comparison. No history of liver disease. He drinks alcohol, previously more than he has recently. He drinks less than once per week, perhaps 2 days per month he will have a few drinks.     Past Medical History:  Diagnosis Date  . Hemorrhoids   . Seizures ([place])    Childhood - none in adulthood  . Sleep apnea      Past Surgical History:  Procedure Laterality Date  . NOSE SURGERY     Septal surgery   Family History  Problem Relation Age of Onset  . Breast cancer Mother   . Healthy Maternal Grandmother   . Healthy Maternal Grandfather   . Healthy Paternal Grandmother    Social History  Substance Use Topics  . Smoking status: Former Smoker    Packs/day: 1.00    Years: 25.00  . Smokeless tobacco: Never Used  . Alcohol use 1.2 - 1.8 oz/week    2 - 3 Glasses of wine per week   Current Outpatient Prescriptions  Medication Sig Dispense Refill  . aspirin EC 81 MG tablet Take 81 mg by mouth daily.    Marland Kitchen acetaminophen (TYLENOL) 325 MG tablet Take 650 mg by mouth every 6  (six) hours as needed.    Marland Kitchen ketoconazole (NIZORAL) 2 % cream Apply 1 application topically daily. 15 g 2  . sildenafil (REVATIO) 20 MG tablet Take 1-5 tablets by mouth daily as needed for erectile dysfunction. 50 tablet 1  . Zinc Sulfate (ZINC 15 PO) Take by mouth.     No current facility-administered medications for this visit.    No Known Allergies   Review of Systems: All systems reviewed and negative except where noted in HPI.   Lab Results  Component Value Date   WBC 5.7 10/28/2016   HGB 15.2 10/28/2016   HCT 44.4 10/28/2016   MCV 92.5 10/28/2016   PLT 259.0 10/28/2016    Lab Results  Component Value Date   CREATININE 1.13 10/28/2016   BUN 22 10/28/2016   NA 140 10/28/2016   K 4.9 10/28/2016   CL 106 10/28/2016   CO2 29 10/28/2016    Lab Results  Component Value Date   ALT 45 10/28/2016   AST 55 (H) 10/28/2016   ALKPHOS 53 10/28/2016   BILITOT 0.3 10/28/2016     Physical Exam: BP (!) 152/90   Pulse 88   Resp 16   Ht 5\' 9"  (1.753 m)   Wt 216 lb (98 kg)  BMI 31.90 kg/m  Constitutional: Pleasant,well-developed, male in no acute distress. HEENT: Normocephalic and atraumatic. Conjunctivae are normal. No scleral icterus. Neck supple.  Cardiovascular: Normal rate, regular rhythm.  Pulmonary/chest: Effort normal and breath sounds normal. No wheezing, rales or rhonchi. Abdominal: Soft, protuberant, nontender. There are no masses palpable. No hepatomegaly. Extremities: no edema Lymphadenopathy: No cervical adenopathy noted. Neurological: Alert and oriented to person place and time. Skin: Skin is warm and dry. No rashes noted. Psychiatric: Normal mood and affect. Behavior is normal.   ASSESSMENT AND PLAN: 61 year old male here for new patient visit to discuss the following issues:  History of colon polyps - per the patient's reported sounds like he is due for surveillance colonoscopy at the time, suspect he had a few adenomas removed. We will reach out to  obtain records to clarify this history. Pending this result shows he is due for surveillance colonoscopy, we'll schedule him for this, he prefers to do this in a few months and will call back to coordinate when his family is in town to be able to drive him. I discussed risks and benefits of colonoscopy with him and he wished proceed.  Hemorrhoids - status post banding in the past with minimal improvement. Recommend daily fiber supplement at this time see if this helps, and we'll evaluate at the time of this colonoscopy. Based off results we'll consider another attempt at banding versus surgical evaluation depending on how much this bothers him. He agreed.  Elevated AST and ALT - one time mild elevation, recommend we repeat this in a few weeks to see if this is persistently elevated. If these remain persistently elevated he will need serologic workup, and also ultrasound of the liver to assess for steatosis. He should minimize alcohol intake in the interim. He agreed  Doolittle Cellar, MD [facility] Pager [phone]  CC: [place], FNP

## 2016-11-05 NOTE — Patient Instructions (Signed)
If you are age 59 or older, your body mass index should be between 23-30. Your Body mass index is 31.9 kg/m. If this is out of the aforementioned range listed, please consider follow up with your Primary Care Provider.  If you are age 74 or younger, your body mass index should be between 19-25. Your Body mass index is 31.9 kg/m. If this is out of the aformentioned range listed, please consider follow up with your Primary Care Provider.   Your physician has requested that you go to the basement for the following lab work before leaving today:  LFT  Please purchase over the counter [employer].  We will contact your physician in [place] for records and call if needed.  Please call us back when you are ready to schedule your colonoscopy.  Thank you.

## 2016-11-13 ENCOUNTER — Telehealth: Payer: Self-pay | Admitting: Gastroenterology

## 2016-11-13 NOTE — Telephone Encounter (Signed)
Looks like patient was to call back when he was ready to schedule his colonoscopy. He will need a pre-visit appointment too. Please schedule him for both appointments. Dr. Havery Moros is still waiting on the [place] records too, if there is anything different when we receive the records appointments can always be changed.

## 2016-11-14 ENCOUNTER — Encounter: Payer: Self-pay | Admitting: Gastroenterology

## 2016-12-09 DIAGNOSIS — L219 Seborrheic dermatitis, unspecified: Secondary | ICD-10-CM | POA: Insufficient documentation

## 2016-12-23 ENCOUNTER — Other Ambulatory Visit (INDEPENDENT_AMBULATORY_CARE_PROVIDER_SITE_OTHER): Payer: 59

## 2016-12-23 ENCOUNTER — Other Ambulatory Visit: Payer: Self-pay

## 2016-12-23 DIAGNOSIS — Z86010 Personal history of colonic polyps: Secondary | ICD-10-CM | POA: Diagnosis not present

## 2016-12-23 DIAGNOSIS — K649 Unspecified hemorrhoids: Secondary | ICD-10-CM | POA: Diagnosis not present

## 2016-12-23 DIAGNOSIS — R7989 Other specified abnormal findings of blood chemistry: Secondary | ICD-10-CM

## 2016-12-23 DIAGNOSIS — R945 Abnormal results of liver function studies: Principal | ICD-10-CM

## 2016-12-23 LAB — HEPATIC FUNCTION PANEL
ALBUMIN: 4.1 g/dL (ref 3.5–5.2)
ALT: 26 U/L (ref 0–53)
AST: 23 U/L (ref 0–37)
Alkaline Phosphatase: 50 U/L (ref 39–117)
BILIRUBIN TOTAL: 0.5 mg/dL (ref 0.2–1.2)
Bilirubin, Direct: 0.2 mg/dL (ref 0.0–0.3)
Total Protein: 7 g/dL (ref 6.0–8.3)

## 2016-12-23 NOTE — Progress Notes (Unsigned)
lft

## 2017-01-17 ENCOUNTER — Encounter: Payer: Self-pay | Admitting: Gastroenterology

## 2017-01-17 ENCOUNTER — Ambulatory Visit: Payer: 59

## 2017-01-17 VITALS — Ht 69.0 in | Wt 208.0 lb

## 2017-01-17 DIAGNOSIS — Z860100 Personal history of colon polyps, unspecified: Secondary | ICD-10-CM

## 2017-01-17 MED ORDER — SUPREP BOWEL PREP KIT 17.5-3.13-1.6 GM/177ML PO SOLN: 1.0000 | Freq: Once | ORAL | 0 refills | Status: AC

## 2017-01-17 NOTE — Progress Notes (Signed)
No diet meds No home oxygen No allergies to eggs or soy No past problems with anesthesia  Registered emmi 

## 2017-01-28 ENCOUNTER — Encounter: Payer: Self-pay | Admitting: Family

## 2017-01-28 ENCOUNTER — Encounter: Payer: Self-pay | Admitting: Gastroenterology

## 2017-01-28 ENCOUNTER — Other Ambulatory Visit: Payer: Self-pay | Admitting: Family

## 2017-01-28 DIAGNOSIS — R040 Epistaxis: Secondary | ICD-10-CM | POA: Insufficient documentation

## 2017-01-31 ENCOUNTER — Other Ambulatory Visit: Payer: Self-pay

## 2017-01-31 ENCOUNTER — Ambulatory Visit (AMBULATORY_SURGERY_CENTER): Payer: 59 | Admitting: Gastroenterology

## 2017-01-31 ENCOUNTER — Encounter: Payer: Self-pay | Admitting: Gastroenterology

## 2017-01-31 ENCOUNTER — Telehealth: Payer: Self-pay

## 2017-01-31 VITALS — BP 129/84 | HR 70 | Temp 97.8°F | Resp 13 | Ht 69.0 in | Wt 208.0 lb

## 2017-01-31 DIAGNOSIS — D125 Benign neoplasm of sigmoid colon: Secondary | ICD-10-CM

## 2017-01-31 DIAGNOSIS — Z86010 Personal history of colonic polyps: Secondary | ICD-10-CM

## 2017-01-31 DIAGNOSIS — K635 Polyp of colon: Secondary | ICD-10-CM | POA: Diagnosis not present

## 2017-01-31 DIAGNOSIS — K6289 Other specified diseases of anus and rectum: Secondary | ICD-10-CM

## 2017-01-31 MED ORDER — SODIUM CHLORIDE 0.9 % IV SOLN: 500.0000 mL | INTRAVENOUS | Status: DC

## 2017-01-31 NOTE — Progress Notes (Signed)
Called to room to assist during endoscopic procedure.  Patient ID and intended procedure confirmed with present staff. Received instructions for my participation in the procedure from the performing physician.  

## 2017-01-31 NOTE — Progress Notes (Signed)
Alert and oriented x3, pleased with MAC, report to RN  

## 2017-01-31 NOTE — Progress Notes (Signed)
Pt's states no medical or surgical changes since previsit or office visit. 

## 2017-01-31 NOTE — Patient Instructions (Signed)
Handout given on polyps and hemorrhoids   YOU HAD AN ENDOSCOPIC PROCEDURE TODAY: Refer to the procedure report and other information in the discharge instructions given to you for any specific questions about what was found during the examination. If this information does not answer your questions, please call [place] office at [phone] to clarify.   YOU SHOULD EXPECT: Some feelings of bloating in the abdomen. Passage of more gas than usual. Walking can help get rid of the air that was put into your GI tract during the procedure and reduce the bloating. If you had a lower endoscopy (such as a colonoscopy or flexible sigmoidoscopy) you may notice spotting of blood in your stool or on the toilet paper. Some abdominal soreness may be present for a day or two, also.  DIET: Your first meal following the procedure should be a light meal and then it is ok to progress to your normal diet. A half-sandwich or bowl of soup is an example of a good first meal. Heavy or fried foods are harder to digest and may make you feel nauseous or bloated. Drink plenty of fluids but you should avoid alcoholic beverages for 24 hours. If you had a esophageal dilation, please see attached instructions for diet.    ACTIVITY: Your care partner should take you home directly after the procedure. You should plan to take it easy, moving slowly for the rest of the day. You can resume normal activity the day after the procedure however YOU SHOULD NOT DRIVE, use power tools, machinery or perform tasks that involve climbing or major physical exertion for 24 hours (because of the sedation medicines used during the test).   SYMPTOMS TO REPORT IMMEDIATELY: A gastroenterologist can be reached at any hour. Please call [phone]  for any of the following symptoms:  Following lower endoscopy (colonoscopy, flexible sigmoidoscopy) Excessive amounts of blood in the stool  Significant tenderness, worsening of abdominal pains  Swelling of the  abdomen that is new, acute  Fever of 100 or higher    FOLLOW UP:  If any biopsies were taken you will be contacted by phone or by letter within the next 1-3 weeks. Call [phone]  if you have not heard about the biopsies in 3 weeks.  Please also call with any specific questions about appointments or follow up tests.  

## 2017-01-31 NOTE — Telephone Encounter (Signed)
Patient is scheduled for MRI of pelvis at [facility] on 7/6 arrive at 7:45 for 8:00 am test, no prep. Patient denies metals in body, other than dental implant, not claustrophobic.

## 2017-01-31 NOTE — Op Note (Addendum)
[facility] Patient Name: Wenzel Backlund Procedure Date: 01/31/2017 10:41 AM MRN: [id number] Endoscopist: Remo Lipps P. Perfecto Purdy MD, MD Age: 61 Referring MD:  Date of Birth: 30-Dec-1955 Gender: Male Account #: [id number] Procedure:                Colonoscopy Indications:              High risk colon cancer surveillance: Personal                            history of colonic polyps Medicines:                Monitored Anesthesia Care Procedure:                Pre-Anesthesia Assessment:                           - Prior to the procedure, a History and Physical                            was performed, and patient medications and                            allergies were reviewed. The patient's tolerance of                            previous anesthesia was also reviewed. The risks                            and benefits of the procedure and the sedation                            options and risks were discussed with the patient.                            All questions were answered, and informed consent                            was obtained. Prior Anticoagulants: The patient has                            taken no previous anticoagulant or antiplatelet                            agents. ASA Grade Assessment: II - A patient with                            mild systemic disease. After reviewing the risks                            and benefits, the patient was deemed in                            satisfactory condition to undergo the procedure.  After obtaining informed consent, the colonoscope                            was passed under direct vision. Throughout the                            procedure, the patient's blood pressure, pulse, and                            oxygen saturations were monitored continuously. The                            Colonoscope was introduced through the anus and                            advanced to the the cecum,  identified by                            appendiceal orifice and ileocecal valve. The                            colonoscopy was performed without difficulty. The                            patient tolerated the procedure well. The quality                            of the bowel preparation was adequate. The                            ileocecal valve, appendiceal orifice, and rectum                            were photographed. Scope In: 10:55:41 AM Scope Out: 11:09:38 AM Scope Withdrawal Time: 0 hours 10 minutes 34 seconds  Total Procedure Duration: 0 hours 13 minutes 57 seconds  Findings:                 The perianal exam findings include suspected                            perianal furuncle x 2, no obvious fistulous tract.                           A diminutive polyp was found in the sigmoid colon.                            The polyp was sessile. The polyp was removed with a                            cold biopsy forceps. Resection and retrieval were                            complete.  Multiple medium-mouthed diverticula were found in                            the left colon.                           Internal hemorrhoids were found during retroflexion                            with evidence of prior scarring from banding.                           The exam was otherwise without abnormality. Complications:            No immediate complications. Estimated blood loss:                            Minimal. Estimated Blood Loss:     Estimated blood loss was minimal. 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 Seems mostly                            superficial but deeper level of involvement is                            possible. No obvious fistulous tract found on                            perianal exam, but it is possible.                           - One diminutive polyp in the sigmoid colon,                            removed with a  cold biopsy forceps. Resected and                            retrieved.                           - Diverticulosis in the left colon.                           - Internal hemorrhoids.                           - The examination was otherwise normal. Recommendation:           - Patient has a contact number available for                            emergencies. The signs and symptoms of potential                            delayed complications were discussed with the  patient. Return to normal activities tomorrow.                            Written discharge instructions were provided to the                            patient.                           - Resume previous diet.                           - Continue present medications.                           - Await pathology results.                           - Repeat colonoscopy is recommended for                            surveillance. The colonoscopy date will be                            determined after pathology results from today's                            exam become available for review.                           - Trial of topical benzoil peroxide for what                            appears to be superficial perianal furuncles, but                            also recommend cross sectional imaging of the                            pelvis with MRI to ensure no deeper level of                            involvement / evidence of fistula given chronicity                            of symptoms.                           - Consideration for hemorrhoid banding if                            hemorrhoid symptoms persist Remo Lipps P. Yacqub Baston MD, MD 01/31/2017 11:17:52 AM This report has been signed electronically.

## 2017-01-31 NOTE — Telephone Encounter (Signed)
-----   Message from Manus Gunning, MD sent at 01/31/2017 12:30 PM EDT ----- Regarding: MRI pelvis Almyra Free could you please coordinate MRI pelvis for this patient for rectal discomfort - rule out fistula / abscess? Thanks

## 2017-02-03 ENCOUNTER — Telehealth: Payer: Self-pay

## 2017-02-03 NOTE — Telephone Encounter (Signed)
  Follow up Call-  Call back number 01/31/2017  Post procedure Call Back phone  # [phone]  Permission to leave phone message Yes     Patient questions:  Do you have a fever, pain , or abdominal swelling? No. Pain Score  0 *  Have you tolerated food without any problems? Yes.    Have you been able to return to your normal activities? Yes.    Do you have any questions about your discharge instructions: Diet   No. Medications  No. Follow up visit  No.  Do you have questions or concerns about your Care? No.  Actions: * If pain score is 4 or above: No action needed, pain <4.

## 2017-02-07 ENCOUNTER — Telehealth: Payer: Self-pay

## 2017-02-07 ENCOUNTER — Ambulatory Visit (HOSPITAL_COMMUNITY)
Admission: RE | Admit: 2017-02-07 | Discharge: 2017-02-07 | Disposition: A | Discharge status: Home / Self Care | Payer: 59 | Source: Ambulatory Visit | Attending: Gastroenterology | Admitting: Gastroenterology

## 2017-02-07 ENCOUNTER — Encounter: Payer: Self-pay | Admitting: Gastroenterology

## 2017-02-07 DIAGNOSIS — K6289 Other specified diseases of anus and rectum: Secondary | ICD-10-CM | POA: Diagnosis present

## 2017-02-07 DIAGNOSIS — K603 Anal fistula: Secondary | ICD-10-CM | POA: Diagnosis not present

## 2017-02-07 MED ORDER — GADOBENATE DIMEGLUMINE 529 MG/ML IV SOLN
20.0000 mL | Freq: Once | INTRAVENOUS | Status: AC | PRN
  Administered 2017-02-07: 19 mL via INTRAVENOUS

## 2017-02-07 NOTE — Telephone Encounter (Signed)
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 On perianal exam at the time of colonoscopy, it was not clear if this was a fistula versus furuncle. There is no evidence of an abscess. He denies any pain in the rectum at this time or fevers. Should he develop this in the future I asked him to call me, would have low threshold to add antibiotics.  Otherwise for management of this I would like to refer him to Dr. Johney Maine of surgery. I discussed what this would entail and he wished to proceed.   Can you help coordinate a referral to Dr. Johney Maine for management of perianal fistula? Thank you

## 2017-02-07 NOTE — Telephone Encounter (Signed)
Records faxed to [facility] attention Dr Johney Maine.

## 2017-03-06 ENCOUNTER — Ambulatory Visit: Payer: Self-pay | Admitting: Surgery

## 2017-05-05 HISTORY — PX: RECTAL SURGERY: SHX760

## 2017-05-16 ENCOUNTER — Other Ambulatory Visit: Payer: Self-pay | Admitting: Surgery

## 2017-06-10 ENCOUNTER — Ambulatory Visit (INDEPENDENT_AMBULATORY_CARE_PROVIDER_SITE_OTHER): Payer: 59 | Admitting: General Practice

## 2017-06-10 DIAGNOSIS — Z23 Encounter for immunization: Secondary | ICD-10-CM | POA: Diagnosis not present

## 2017-08-08 ENCOUNTER — Encounter: Payer: Self-pay | Admitting: Gastroenterology

## 2017-08-12 ENCOUNTER — Encounter: Payer: Self-pay | Admitting: Nurse Practitioner

## 2017-08-13 ENCOUNTER — Other Ambulatory Visit: Payer: Self-pay | Admitting: Nurse Practitioner

## 2017-08-13 DIAGNOSIS — R0683 Snoring: Secondary | ICD-10-CM

## 2017-08-13 DIAGNOSIS — E6609 Other obesity due to excess calories: Secondary | ICD-10-CM

## 2017-08-13 DIAGNOSIS — Z6831 Body mass index (BMI) 31.0-31.9, adult: Secondary | ICD-10-CM

## 2017-08-13 NOTE — Progress Notes (Signed)
orders

## 2017-09-10 ENCOUNTER — Ambulatory Visit (INDEPENDENT_AMBULATORY_CARE_PROVIDER_SITE_OTHER): Payer: 59 | Admitting: Neurology

## 2017-09-10 ENCOUNTER — Encounter: Payer: Self-pay | Admitting: Neurology

## 2017-09-10 VITALS — BP 158/104 | HR 88 | Ht 70.0 in | Wt 221.0 lb

## 2017-09-10 DIAGNOSIS — G4719 Other hypersomnia: Secondary | ICD-10-CM | POA: Diagnosis not present

## 2017-09-10 DIAGNOSIS — R519 Headache, unspecified: Secondary | ICD-10-CM

## 2017-09-10 DIAGNOSIS — R51 Headache: Secondary | ICD-10-CM

## 2017-09-10 DIAGNOSIS — R351 Nocturia: Secondary | ICD-10-CM | POA: Diagnosis not present

## 2017-09-10 DIAGNOSIS — R0681 Apnea, not elsewhere classified: Secondary | ICD-10-CM | POA: Diagnosis not present

## 2017-09-10 DIAGNOSIS — G4763 Sleep related bruxism: Secondary | ICD-10-CM

## 2017-09-10 DIAGNOSIS — R0683 Snoring: Secondary | ICD-10-CM | POA: Diagnosis not present

## 2017-09-10 NOTE — Progress Notes (Signed)
Subjective:    Patient ID: Elijah Robertson is a 62 y.o. male.  HPI     Elijah Age, MD, PhD [facility] [address] [place], [place] [address]  Dear [name],   I saw your patient, Elijah Robertson, upon your kind request in my neurologic clinic today for initial consultation of his sleep disorder, in particular, concern for underlying obstructive sleep apnea. The patient is unaccompanied today. As you know, Elijah Robertson is a 62 year old right-handed gentleman with an underlying medical history of childhood seizures, status post septoplasty, reflux disease and borderline obesity, who reports snoring and excessive daytime somnolence. Epworth sleepiness score is 8 out of 24, fatigue score is 20/63. He lives alone, is divorced, has a girlfriend who lives in [place]. He works in Engineer, technical sales. He has 3 children, all in [place]. He quit smoking in 2012 and drinks alcohol socially, smokes marijuana occasionally, caffeine in the form of coffee, 2-3 cups a day. He reports a bedtime between 10 and 11 typically, rising between 6 and 6:30. He denies telltale symptoms of restless leg syndrome. His girlfriend has noticed breathing pauses while he is asleep and very loud snoring at times, disturbing enough to not be able to sleep in the same bedroom. He is not aware of any family history of OSA. He would be willing to consider CPAP therapy. He has occasional morning headaches, he has had nocturia about 2-3 times per average night. He headaches are mild, not typically enough to take medication for this.  His Past Medical History Is Significant For: Past Medical History:  Diagnosis Date  . GERD (gastroesophageal reflux disease)   . Hemorrhoids   . Seizures ([place])    Childhood - none in adulthood    His Past Surgical History Is Significant For: Past Surgical History:  Procedure Laterality Date  . COLONOSCOPY W/ POLYPECTOMY    . NOSE SURGERY     Septal surgery     Her Family History Is Significant For: Family History  Problem Relation Robertson of Onset  . Breast cancer Mother   . Healthy Maternal Grandmother   . Healthy Maternal Grandfather   . Healthy Paternal Grandmother   . Colon cancer Neg Hx     His Social History Is Significant For: Social History   Socioeconomic History  . Marital status: Single    Spouse name: None  . Number of children: 3  . Years of education: 42  . Highest education level: None  Social Needs  . Financial resource strain: None  . Food insecurity - worry: None  . Food insecurity - inability: None  . Transportation needs - medical: None  . Transportation needs - non-medical: None  Occupational History  . None  Tobacco Use  . Smoking status: Former Smoker    Packs/day: 1.00    Years: 25.00    Pack years: 25.00  . Smokeless tobacco: Never Used  Substance and Sexual Activity  . Alcohol use: Yes    Alcohol/week: 1.2 - 1.8 oz    Types: 2 - 3 Glasses of wine per week  . Drug use: Yes    Types: Marijuana    Comment: 1x every couple of months,FEB 2018  . Sexual activity: Yes  Other Topics Concern  . None  Social History Narrative   Fun/Hobby: Engineer, civil (consulting) sports, hang out with friends; Doctor, general practice; Travel    His Allergies Are:  No Known Allergies:   His Current Medications Are:  Outpatient Encounter Medications as of  09/10/2017  Medication Sig  . acetaminophen (TYLENOL) 325 MG tablet Take 650 mg by mouth every 6 (six) hours as needed.  Marland Kitchen aspirin EC 81 MG tablet Take 81 mg by mouth daily.  . [employer] SALINE NASAL SPRAY NA Place into the nose.  Marland Kitchen desonide (DESOWEN) 0.05 % ointment   . ketoconazole (NIZORAL) 2 % cream Apply 1 application topically daily.  Marland Kitchen ketoconazole (NIZORAL) 2 % shampoo   . sildenafil (REVATIO) 20 MG tablet Take 1-5 tablets by mouth daily as needed for erectile dysfunction.   Facility-Administered Encounter Medications as of 09/10/2017  Medication  . 0.9 %  sodium chloride infusion   :  Review of Systems:  Out of a complete 14 point review of systems, all are reviewed and negative with the exception of these symptoms as listed below:  Review of Systems  Neurological:       Pt presents today to discuss his sleep. Pt has never had a sleep study but does endorse snoring.  Epworth Sleepiness Scale 0= would never doze 1= slight chance of dozing 2= moderate chance of dozing 3= high chance of dozing  Sitting and reading: 1 Watching TV: 2 Sitting inactive in a public place (ex. Theater or meeting): 1 As a passenger in a car for an hour without a break: 1 Lying down to rest in the afternoon: 2 Sitting and talking to someone: 0 Sitting quietly after lunch (no alcohol): 0 In a car, while stopped in traffic: 1 Total: 8     Objective:  Neurological Exam  Physical Exam Physical Examination:   Vitals:   09/10/17 1613  BP: (!) 158/104  Pulse: 88   General Examination: The patient is a very pleasant 62 y.o. male in no acute distress. He appears well-developed and well-nourished and well groomed.   HEENT: Normocephalic, atraumatic, pupils are equal, round and reactive to light and accommodation. Extraocular tracking is good without limitation to gaze excursion or nystagmus noted. Normal smooth pursuit is noted. Hearing is grossly intact. Face is symmetric with normal facial animation and normal facial sensation. Speech is clear with no dysarthria noted. There is no hypophonia. There is no lip, neck/head, jaw or voice tremor. Neck is supple with full range of passive and active motion. There are no carotid bruits on auscultation. Oropharynx exam reveals: mild mouth dryness, adequate dental hygiene and marked airway crowding, due to tonsils of 2-3+, and larger uvula, and smaller airway opening. Mallampati is class II. Tongue protrudes centrally and palate elevates symmetrically. Neck size is 16 5/8. He has a mild overbite.   Chest: Clear to auscultation without wheezing,  rhonchi or crackles noted.  Heart: S1+S2+0, regular and normal without murmurs, rubs or gallops noted.   Abdomen: Soft, non-tender and non-distended with normal bowel sounds appreciated on auscultation.  Extremities: There is no pitting edema in the distal lower extremities bilaterally. Pedal pulses are intact.  Skin: Warm and dry without trophic changes noted.  Musculoskeletal: exam reveals no obvious joint deformities, tenderness or joint swelling or erythema.   Neurologically:  Mental status: The patient is awake, alert and oriented in all 4 spheres. His immediate and remote memory, attention, language skills and fund of knowledge are appropriate. There is no evidence of aphasia, agnosia, apraxia or anomia. Speech is clear with normal prosody and enunciation. Thought process is linear. Mood is normal and affect is normal.  Cranial nerves II - XII are as described above under HEENT exam. In addition: shoulder shrug is normal with equal  shoulder height noted. Motor exam: Normal bulk, strength and tone is noted. There is no drift, tremor or rebound. Romberg is negative. Reflexes are 2+ throughout. Fine motor skills and coordination: intact with normal finger taps, normal hand movements, normal rapid alternating patting, normal foot taps and normal foot agility.  Cerebellar testing: No dysmetria or intention tremor on finger to nose testing. Heel to shin is unremarkable bilaterally. There is no truncal or gait ataxia.  Sensory exam: intact to light touch, pinprick, vibration, temperature sense in the upper and lower extremities.  Gait, station and balance: He stands easily. No veering to one side is noted. No leaning to one side is noted. Posture is Robertson-appropriate and stance is narrow based. Gait shows normal stride length and pace.                 Assessment and Plan:   In summary, Breven Guidroz is a very pleasant 62 y.o.-year old male with an underlying medical history of childhood  seizures, status post septoplasty, reflux disease and borderline obesity, whose history and physical exam are concerning for obstructive sleep apnea (OSA). I had a long chat with the patient about my findings and the diagnosis of OSA, its prognosis and treatment options. We talked about medical treatments, surgical interventions and non-pharmacological approaches. I explained in particular the risks and ramifications of untreated moderate to severe OSA, especially with respect to developing cardiovascular disease down the Road, including congestive heart failure, difficult to treat hypertension, cardiac arrhythmias, or stroke. Even type 2 diabetes has, in part, been linked to untreated OSA. Symptoms of untreated OSA include daytime sleepiness, memory problems, mood irritability and mood disorder such as depression and anxiety, lack of energy, as well as recurrent headaches, especially morning headaches. We talked about trying to maintain a healthy lifestyle in general, as well as the importance of weight control. I encouraged the patient to eat healthy, exercise daily and keep well hydrated, to keep a scheduled bedtime and wake time routine, to not skip any meals and eat healthy snacks in between meals. I advised the patient not to drive when feeling sleepy. I recommended the following at this time: sleep study with potential positive airway pressure titration. (We will score hypopneas at 4%).   I explained the sleep test procedure to the patient and also outlined possible surgical and non-surgical treatment options of OSA, including the use of a custom-made dental device (which would require a referral to a specialist dentist or oral surgeon), upper airway surgical options, such as pillar implants, radiofrequency surgery, tongue base surgery, and UPPP (which would involve a referral to an ENT surgeon). Rarely, jaw surgery such as mandibular advancement may be considered.  I also explained the CPAP treatment  option to the patient, who indicated that he would be willing to try CPAP if the need arises. I explained the importance of being compliant with PAP treatment, not only for insurance purposes but primarily to improve His symptoms, and for the patient's long term health benefit, including to reduce His cardiovascular risks. I answered all his questions today and the patient was in agreement. I would like to see him back after the sleep study is completed and encouraged him to call with any interim questions, concerns, problems or updates.   Thank you very much for allowing me to participate in the care of this nice patient. If I can be of any further assistance to you please do not hesitate to call me at [phone].  Sincerely,  Elijah Age, MD, PhD

## 2017-09-10 NOTE — Patient Instructions (Addendum)

## 2017-09-11 ENCOUNTER — Telehealth: Payer: Self-pay

## 2017-09-11 DIAGNOSIS — R0681 Apnea, not elsewhere classified: Secondary | ICD-10-CM

## 2017-09-11 NOTE — Telephone Encounter (Signed)
HST order placed. 

## 2017-09-11 NOTE — Telephone Encounter (Signed)
[employer] denied in lab sleep study, need HST order

## 2017-09-29 ENCOUNTER — Ambulatory Visit (INDEPENDENT_AMBULATORY_CARE_PROVIDER_SITE_OTHER): Payer: 59 | Admitting: Neurology

## 2017-09-29 DIAGNOSIS — G4733 Obstructive sleep apnea (adult) (pediatric): Secondary | ICD-10-CM

## 2017-09-29 DIAGNOSIS — G4734 Idiopathic sleep related nonobstructive alveolar hypoventilation: Secondary | ICD-10-CM

## 2017-09-29 DIAGNOSIS — R0681 Apnea, not elsewhere classified: Secondary | ICD-10-CM

## 2017-10-01 ENCOUNTER — Other Ambulatory Visit: Payer: Self-pay | Admitting: Neurology

## 2017-10-01 ENCOUNTER — Telehealth: Payer: Self-pay

## 2017-10-01 DIAGNOSIS — G4734 Idiopathic sleep related nonobstructive alveolar hypoventilation: Secondary | ICD-10-CM

## 2017-10-01 DIAGNOSIS — Z87898 Personal history of other specified conditions: Secondary | ICD-10-CM

## 2017-10-01 DIAGNOSIS — G4719 Other hypersomnia: Secondary | ICD-10-CM

## 2017-10-01 DIAGNOSIS — E669 Obesity, unspecified: Secondary | ICD-10-CM

## 2017-10-01 DIAGNOSIS — G4733 Obstructive sleep apnea (adult) (pediatric): Secondary | ICD-10-CM

## 2017-10-01 NOTE — Telephone Encounter (Signed)
We will set patient up with autoPAP at home, as insurance denied in house titration study for OSA. Pls process order and notify patient and set up FU in 10 weeks with me or NP.     

## 2017-10-01 NOTE — Telephone Encounter (Signed)
[facility] will deny cpap titration due to no central apnea on HST. Need Auto cpap order

## 2017-10-01 NOTE — Procedures (Signed)
  [facility] [address], [place] [address] NAME:  Elijah Robertson                                                      DOB: 1956-06-11 MEDICAL RECORD NUMBER [id number]                                     DOS: 09/29/17 REFERRING PHYSICIAN: Pricilla Holm STUDY PERFORMED: Home Sleep Test HISTORY: 62 year old man with a history of childhood seizures, status post septoplasty, reflux disease and borderline obesity, who reports snoring and excessive daytime somnolence. Epworth sleepiness score is 8 out of 24, BMI:31.7.   STUDY RESULTS:  Total Recording Time:  7 hours, 47  minutes Total Apnea/Hypopnea Index (AHI):  50.3/hr, RDI: 52.3 /hr Average Oxygen Saturation:    92%, Lowest Oxygen Desaturation: 75   %  Total Time Oxygen Saturation Below 89% was 38 minutes (8%)  Average Heart Rate:  77 bpm (between 62 and  130 bpm) IMPRESSION: OSA, Nocturnal Hypoxemia RECOMMENDATION: This home sleep test demonstrates severe obstructive sleep apnea with a total AHI of 50.3/hour and O2 nadir of 75%. Given the patient's medical history and sleep related complaints, treatment with positive airway pressure (in the form of CPAP) is recommended. Given the patient's medical history and sleep related complaints, treatment with positive airway pressure (in the form of CPAP) is recommended. This will require a full night CPAP titration study for proper treatment settings, O2 monitoring and mask fitting. Based on the severity of the sleep disordered breathing an attended titration study is indicated. Please note that untreated obstructive sleep apnea carries additional perioperative morbidity. Patients with significant obstructive sleep apnea should receive perioperative PAP therapy and the surgeons and particularly the anesthesiologist should be informed of the diagnosis and the severity of the sleep disordered breathing. The patient should be cautioned not to drive, work at heights,  or operate dangerous or heavy equipment when tired or sleepy. Review and reiteration of good sleep hygiene measures should be pursued with any patient. Other causes of the patient's symptoms, including circadian rhythm disturbances, an underlying mood disorder, medication effect and/or an underlying medical problem cannot be ruled out based on this test. Clinical correlation is recommended. The patient and his referring provider will be notified of the test results. The patient will be seen in follow up in sleep clinic at [facility].  I certify that I have reviewed the raw data recording prior to the issuance of this report in accordance with the standards of the American Academy of Sleep Medicine (AASM).  Star Age, MD, PhD Diplomat, ABPN (Neurology and Sleep)

## 2017-10-01 NOTE — Progress Notes (Signed)
Patient referred by Ms. Shambley, seen by me on 09/10/17, HST on 09/29/17:  Please call and notify the patient that the recent home sleep test did suggest the diagnosis of severe obstructive sleep apnea and that I recommend treatment for this in the form of CPAP. I will request an overnight sleep study for proper titration and mask fitting. Please explain to patient and arrange for a CPAP titration study. I have placed an order in the chart. Thanks,  Elijah Age, MD, PhD [facility]

## 2017-10-02 NOTE — Telephone Encounter (Signed)
"  Patient referred by Ms. Shambley, seen by me on 09/10/17, HST on 09/29/17:  Please call and notify the patient that the recent home sleep test did suggest the diagnosis of severe obstructive sleep apnea and that I recommend treatment for this in the form of CPAP. I will request an overnight sleep study for proper titration and mask fitting. Please explain to patient and arrange for a CPAP titration study. I have placed an order in the chart. Thanks,  Star Age, MD, PhD  [facility])"  I called pt. I advised pt that Dr. Rexene Alberts reviewed their sleep study results and found that pt pt has severe osa. Dr. Rexene Alberts recommends that pt start an auto pap at home since. I reviewed PAP compliance expectations with the pt. Pt is agreeable to starting an auto-PAP. I advised pt that an order will be sent to a DME, [facility], and [facility] will call the pt within about one week after they file with the pt's insurance. [facility] will show the pt how to use the machine, fit for masks, and troubleshoot the auto-PAP if needed. A follow up appt was made for insurance purposes with Dr. Rexene Alberts on 01/01/18 at 8:30am. Pt verbalized understanding to arrive 15 minutes early and bring their auto-PAP. A letter with all of this information in it will be sent to the pt's mychart account as a reminder. I verified with the pt that the address we have on file is correct. Pt verbalized understanding of results. Pt had no questions at this time but was encouraged to call back if questions arise.

## 2017-10-10 ENCOUNTER — Encounter: Payer: Self-pay | Admitting: Gastroenterology

## 2017-10-10 ENCOUNTER — Ambulatory Visit (INDEPENDENT_AMBULATORY_CARE_PROVIDER_SITE_OTHER): Payer: 59 | Admitting: Gastroenterology

## 2017-10-10 VITALS — BP 140/78 | HR 91 | Ht 69.0 in | Wt 218.0 lb

## 2017-10-10 DIAGNOSIS — K648 Other hemorrhoids: Secondary | ICD-10-CM

## 2017-10-10 DIAGNOSIS — K603 Anal fistula: Secondary | ICD-10-CM | POA: Diagnosis not present

## 2017-10-10 DIAGNOSIS — L309 Dermatitis, unspecified: Secondary | ICD-10-CM | POA: Diagnosis not present

## 2017-10-10 NOTE — Progress Notes (Signed)
HPI :  62 year old male here for a follow-up visit.  Since his last visit he had a colonoscopy showing one benign polyp but perianal exam remarkable for perianal furuncle /concern for fistula. Internal hemorrhoids also noted. No evidence of Crohn's disease on colonoscopy. 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  He was referred to Dr. Johney Maine of general surgery and had surgical repair of this.  This was done in ambulatory surgical center and I do not have any report from that particular operation.  He states he recovered well from the surgery he has not had any drainage or pain at the area.  He does state he has some perianal irritation and thought it was due to hemorrhoids.  He notices some blood when he wipes himself and has ongoing irritation.  He has been eating a lot of flaxseed and high-fiber diet.  He states he has regular bowel movements and denies straining.  He does endorse using hydrocortisone cream every day in his perianal area. He is also cleaning himself using wetwipes routinely.  Colonoscopy 01/31/2017 - perianal furuncle x 2, left sided diverticulosis, internal hemorrhoids, one small polyp sigmoid - benign colonic mucosa no adenoma  MRI pelvis - 02/07/2017 - left sided intersphincteric fistula    Past Medical History:  Diagnosis Date  . Diverticulosis   . GERD (gastroesophageal reflux disease)   . Hemorrhoids   . Perianal fistula   . Seizures ([place])    Childhood - none in adulthood  . Sleep apnea      Past Surgical History:  Procedure Laterality Date  . COLONOSCOPY W/ POLYPECTOMY    . NOSE SURGERY     Septal surgery  . RECTAL SURGERY  05/2017   fistula repair   Family History  Problem Relation Age of Onset  . Breast cancer Mother   . Healthy Maternal Grandmother   . Healthy Maternal Grandfather   . Healthy Paternal Grandmother   . Colon cancer Neg Hx    Social History   Tobacco Use  . Smoking status:  Former Smoker    Packs/day: 1.00    Years: 25.00    Pack years: 25.00  . Smokeless tobacco: Never Used  Substance Use Topics  . Alcohol use: Yes    Alcohol/week: 1.2 - 1.8 oz    Types: 2 - 3 Glasses of wine per week  . Drug use: Yes    Types: Marijuana    Comment: 1x every couple of months,FEB 2018   Current Outpatient Medications  Medication Sig Dispense Refill  . acetaminophen (TYLENOL) 325 MG tablet Take 650 mg by mouth every 6 (six) hours as needed.    Marland Kitchen aspirin EC 81 MG tablet Take 81 mg by mouth daily.    . [employer] SALINE NASAL SPRAY NA Place into the nose.    Marland Kitchen desonide (DESOWEN) 0.05 % ointment     . ketoconazole (NIZORAL) 2 % cream Apply 1 application topically daily. 15 g 2  . sildenafil (REVATIO) 20 MG tablet Take 1-5 tablets by mouth daily as needed for erectile dysfunction. 50 tablet 1   Current Facility-Administered Medications  Medication Dose Route Frequency Provider Last Rate Last Dose  . 0.9 %  sodium chloride infusion  500 mL Intravenous Continuous Kadence Mimbs, Carlota Raspberry, MD       No Known Allergies   Review of Systems: All systems reviewed and negative except where noted in HPI.   Lab Results  Component Value Date  WBC 5.7 10/28/2016   HGB 15.2 10/28/2016   HCT 44.4 10/28/2016   MCV 92.5 10/28/2016   PLT 259.0 10/28/2016    Lab Results  Component Value Date   CREATININE 1.13 10/28/2016   BUN 22 10/28/2016   NA 140 10/28/2016   K 4.9 10/28/2016   CL 106 10/28/2016   CO2 29 10/28/2016    Lab Results  Component Value Date   ALT 26 12/23/2016   AST 23 12/23/2016   ALKPHOS 50 12/23/2016   BILITOT 0.5 12/23/2016     Physical Exam: BP 140/78   Pulse 91   Ht 5\' 9"  (1.753 m)   Wt 218 lb (98.9 kg)   BMI 32.19 kg/m  Constitutional: Pleasant,well-developed, male in no acute distress. HEENT: Normocephalic and atraumatic. Conjunctivae are normal. No scleral icterus. Neck supple.  Cardiovascular: Normal rate, regular rhythm.  Pulmonary/chest:  Effort normal and breath sounds normal. No wheezing, rales or rhonchi. Abdominal: Soft, nondistended, nontender. There are no masses palpable. No hepatomegaly. DRE - superficial irritation / excoriations in perianal area with associated erythema, c/w dermatitis. Healed fistulous tracts. No mass or fluctuance / fissure. Anoscopy not done in light of perianal irritation Extremities: no edema Lymphadenopathy: No cervical adenopathy noted. Neurological: Alert and oriented to person place and time. Skin: Skin is warm and dry. No rashes noted. Psychiatric: Normal mood and affect. Behavior is normal.   ASSESSMENT AND PLAN: 62 year old male with a history of perianal fistula status post operative repair who had healed well from this, presenting with perianal symptoms as outlined above.  He appears to have a perianal dermatitis, potentially due to frequent use of wet wipes to clean himself, which can cause a contact dermatitis.  The skin in this area also appears thin from routine use of hydrocortisone.  I am recommending he stop using the wet wipes, and clean himself using wet toilet paper.  I also recommend he stop using hydrocortisone, and use some type of barrier cream such as diaper rash cream / zinc oxide.  We will reach out t to Dr. Clyda Greener office to get the reports from his surgical repair.  I do not think his hemorrhoids are causing his current symptoms at this time and will hold off on banding at this time.  If his symptoms fail to improve but the measures above, asked him to call me back for reassessment in a few weeks.  [place] Cellar, MD [facility] Pager [phone]

## 2017-10-14 ENCOUNTER — Telehealth: Payer: Self-pay | Admitting: Gastroenterology

## 2017-10-14 NOTE — Telephone Encounter (Signed)
Records Received:  Dr. Johney Maine - 05/16/2017 - had surgical treatment of anal fistula (fistulectomy / fistulotomy) and intersphincteric LIFT repair, along with excision of anal canal polyps x 2  Path c/w benign fistulous tract, and benign fibroepithelial polyps

## 2017-10-30 ENCOUNTER — Ambulatory Visit (INDEPENDENT_AMBULATORY_CARE_PROVIDER_SITE_OTHER): Payer: 59 | Admitting: Nurse Practitioner

## 2017-10-30 ENCOUNTER — Encounter: Payer: Self-pay | Admitting: Nurse Practitioner

## 2017-10-30 ENCOUNTER — Other Ambulatory Visit (INDEPENDENT_AMBULATORY_CARE_PROVIDER_SITE_OTHER): Payer: 59

## 2017-10-30 VITALS — BP 142/90 | HR 80 | Temp 98.0°F | Resp 16 | Ht 69.0 in | Wt 217.0 lb

## 2017-10-30 DIAGNOSIS — Z122 Encounter for screening for malignant neoplasm of respiratory organs: Secondary | ICD-10-CM | POA: Diagnosis not present

## 2017-10-30 DIAGNOSIS — Z13220 Encounter for screening for lipoid disorders: Secondary | ICD-10-CM

## 2017-10-30 DIAGNOSIS — Z125 Encounter for screening for malignant neoplasm of prostate: Secondary | ICD-10-CM | POA: Diagnosis not present

## 2017-10-30 DIAGNOSIS — Z0000 Encounter for general adult medical examination without abnormal findings: Secondary | ICD-10-CM

## 2017-10-30 DIAGNOSIS — N521 Erectile dysfunction due to diseases classified elsewhere: Secondary | ICD-10-CM | POA: Diagnosis not present

## 2017-10-30 DIAGNOSIS — R030 Elevated blood-pressure reading, without diagnosis of hypertension: Secondary | ICD-10-CM

## 2017-10-30 LAB — LIPID PANEL
CHOL/HDL RATIO: 5
Cholesterol: 228 mg/dL — ABNORMAL HIGH (ref 0–200)
HDL: 44.8 mg/dL (ref >39.00)
LDL CALC: 148 mg/dL — AB (ref 0–99)
NONHDL: 183.47
Triglycerides: 176 mg/dL — ABNORMAL HIGH (ref 0.0–149.0)
VLDL: 35.2 mg/dL (ref 0.0–40.0)

## 2017-10-30 LAB — COMPREHENSIVE METABOLIC PANEL
ALT: 35 U/L (ref 0–53)
AST: 21 U/L (ref 0–37)
Albumin: 4.1 g/dL (ref 3.5–5.2)
Alkaline Phosphatase: 63 U/L (ref 39–117)
BILIRUBIN TOTAL: 0.4 mg/dL (ref 0.2–1.2)
BUN: 17 mg/dL (ref 6–23)
CALCIUM: 9.7 mg/dL (ref 8.4–10.5)
CHLORIDE: 105 meq/L (ref 96–112)
CO2: 28 meq/L (ref 19–32)
Creatinine, Ser: 1.08 mg/dL (ref 0.40–1.50)
GFR: 74.19 mL/min (ref >60.00)
Glucose, Bld: 106 mg/dL — ABNORMAL HIGH (ref 70–99)
POTASSIUM: 4.8 meq/L (ref 3.5–5.1)
Sodium: 141 mEq/L (ref 135–145)
Total Protein: 7.1 g/dL (ref 6.0–8.3)

## 2017-10-30 LAB — CBC
HEMATOCRIT: 44.6 % (ref 39.0–52.0)
HEMOGLOBIN: 15.5 g/dL (ref 13.0–17.0)
MCHC: 34.8 g/dL (ref 30.0–36.0)
MCV: 92.6 fl (ref 78.0–100.0)
Platelets: 262 10*3/uL (ref 150.0–400.0)
RBC: 4.82 Mil/uL (ref 4.22–5.81)
RDW: 12.9 % (ref 11.5–15.5)
WBC: 5.9 10*3/uL (ref 4.0–10.5)

## 2017-10-30 LAB — PSA: PSA: 4.24 ng/mL — ABNORMAL HIGH (ref 0.10–4.00)

## 2017-10-30 LAB — TESTOSTERONE: Testosterone: 302.69 ng/dL (ref 300.00–890.00)

## 2017-10-30 MED ORDER — SILDENAFIL CITRATE 20 MG PO TABS: ORAL_TABLET | ORAL | 1 refills | Status: DC

## 2017-10-30 NOTE — Assessment & Plan Note (Signed)
-  Prostate cancer screening and PSA options (with potential risks and benefits of testing vs not testing) were discussed along with recent recs/guidelines. -USPSTF grade A and B recommendations reviewed with patient; age-appropriate recommendations, preventive care, screening tests, etc discussed and encouraged; healthy living encouraged; see AVS for patient education given to patient -Discussed importance of 150 minutes of physical activity weekly, eat two servings of fish weekly, eat one serving of tree nuts ( cashews, pistachios, pecans, almonds.Marland Kitchen) every other day, eat 6 servings of fruit/vegetables daily and drink plenty of water and avoid sweet beverages.  - Follow up and care instructions discussed and provided in AVS.  -Reviewed Health Maintenance: declines HIV screening, otherwise up to date  Routine health maintenance - PSA; Future-Screening for prostate cancer - Lipid panel; Future-Screening for cholesterol level - Ambulatory referral to Pulmonology-Encounter for screening for lung cancer

## 2017-10-30 NOTE — Patient Instructions (Signed)
Please head downstairs for lab work.  Please work on your diet and exercise as we discussed. Remember half of your plate should be veggies, one-fourth carbs, one-fourth meat, and don't eat meat at every meal. Also, remember to stay away from sugary drinks. I'd like for you to start incorporating exercise into your daily schedule. Start at 10 minutes a day, working up to 30 minutes five times a week.   Please keep an eye on your blood pressure- follow up for readings over 140/90.  Otherwise, I will plan to see you in 1 year for your annual physical, or sooner If you need me.  It was nice to meet you. Thanks for letting me take care of you today :)   Preventive Care 40-64 Years, Male Preventive care refers to lifestyle choices and visits with your health care provider that can promote health and wellness. What does preventive care include?  A yearly physical exam. This is also called an annual well check.  Dental exams once or twice a year.  Routine eye exams. Ask your health care provider how often you should have your eyes checked.  Personal lifestyle choices, including: ? Daily care of your teeth and gums. ? Regular physical activity. ? Eating a healthy diet. ? Avoiding tobacco and drug use. ? Limiting alcohol use. ? Practicing safe sex. ? Taking low-dose aspirin every day starting at age 67. What happens during an annual well check? The services and screenings done by your health care provider during your annual well check will depend on your age, overall health, lifestyle risk factors, and family history of disease. Counseling Your health care provider may ask you questions about your:  Alcohol use.  Tobacco use.  Drug use.  Emotional well-being.  Home and relationship well-being.  Sexual activity.  Eating habits.  Work and work Statistician.  Screening You may have the following tests or measurements:  Height, weight, and BMI.  Blood pressure.  Lipid and  cholesterol levels. These may be checked every 5 years, or more frequently if you are over 72 years old.  Skin check.  Lung cancer screening. You may have this screening every year starting at age 72 if you have a 30-pack-year history of smoking and currently smoke or have quit within the past 15 years.  Fecal occult blood test (FOBT) of the stool. You may have this test every year starting at age 56.  Flexible sigmoidoscopy or colonoscopy. You may have a sigmoidoscopy every 5 years or a colonoscopy every 10 years starting at age 91.  Prostate cancer screening. Recommendations will vary depending on your family history and other risks.  Hepatitis C blood test.  Hepatitis B blood test.  Sexually transmitted disease (STD) testing.  Diabetes screening. This is done by checking your blood sugar (glucose) after you have not eaten for a while (fasting). You may have this done every 1-3 years.  Discuss your test results, treatment options, and if necessary, the need for more tests with your health care provider. Vaccines Your health care provider may recommend certain vaccines, such as:  Influenza vaccine. This is recommended every year.  Tetanus, diphtheria, and acellular pertussis (Tdap, Td) vaccine. You may need a Td booster every 10 years.  Varicella vaccine. You may need this if you have not been vaccinated.  Zoster vaccine. You may need this after age 46.  Measles, mumps, and rubella (MMR) vaccine. You may need at least one dose of MMR if you were born in 1957 or  later. You may also need a second dose.  Pneumococcal 13-valent conjugate (PCV13) vaccine. You may need this if you have certain conditions and have not been vaccinated.  Pneumococcal polysaccharide (PPSV23) vaccine. You may need one or two doses if you smoke cigarettes or if you have certain conditions.  Meningococcal vaccine. You may need this if you have certain conditions.  Hepatitis A vaccine. You may need this if  you have certain conditions or if you travel or work in places where you may be exposed to hepatitis A.  Hepatitis B vaccine. You may need this if you have certain conditions or if you travel or work in places where you may be exposed to hepatitis B.  Haemophilus influenzae type b (Hib) vaccine. You may need this if you have certain risk factors.  Talk to your health care provider about which screenings and vaccines you need and how often you need them. This information is not intended to replace advice given to you by your health care provider. Make sure you discuss any questions you have with your health care provider. Document Released: 08/18/2015 Document Revised: 04/10/2016 Document Reviewed: 05/23/2015 Elsevier Interactive Patient Education  [employer].

## 2017-10-30 NOTE — Assessment & Plan Note (Signed)
Reports using sildenafil PRN with improvement in sexual function Will update labs and provide refill today - CBC; Future - PSA; Future - Testosterone; Future - sildenafil (REVATIO) 20 MG tablet; Take 1-5 tablets by mouth daily as needed for erectile dysfunction.  Dispense: 50 tablet; Refill: 1

## 2017-10-30 NOTE — Progress Notes (Signed)
Name: Elijah Robertson   MRN: [id number]    DOB: 06-Aug-1955   Date:10/30/2017       Progress Note  Subjective  Chief Complaint  Chief Complaint  Patient presents with  . Establish Care    CPE, fasting    HPI Establishing as a transfer from a prior provider in our clinic. Patient presents for annual CPE and requests refill of sildenafil.  USPSTF grade A and B recommendations:  Diet: Breakfast: oatmeal, fruit, honey, eggs, Lunch- sandwich, fruit, vegetables, Dinner- salads with grilled meat, nuts, peppers, Drink-water, milk, sparkling water Exercise: gym about 5 times a week, sometimes walks and rides bike  Depression: NO Depression screen [facility] 2/9 10/30/2017 10/28/2016  Decreased Interest 0 0  Down, Depressed, Hopeless 0 0  PHQ - 2 Score 0 0    Hypertension: He has had high blood pressure readings in past maintained off medications He says he does not want to take medications but prefers to follow healthy diet and lifestyle BP Readings from Last 3 Encounters:  10/30/17 (!) 142/90  10/10/17 140/78  09/10/17 (!) 158/104   Obesity: Wt Readings from Last 3 Encounters:  10/30/17 217 lb (98.4 kg)  10/10/17 218 lb (98.9 kg)  09/10/17 221 lb (100.2 kg)   BMI Readings from Last 3 Encounters:  10/30/17 32.05 kg/m  10/10/17 32.19 kg/m  09/10/17 31.71 kg/m    Lipids: He has had high cholesterol readings in past maintained off medications He says he does not want to take medications but prefers to follow healthy diet and lifestyle Lab Results  Component Value Date   CHOL 221 (H) 10/28/2016   Lab Results  Component Value Date   HDL 48.60 10/28/2016   Lab Results  Component Value Date   LDLCALC 136 (H) 10/28/2016   Lab Results  Component Value Date   TRIG 183.0 (H) 10/28/2016   Lab Results  Component Value Date   CHOLHDL 5 10/28/2016   No results found for: LDLDIRECT Glucose:  Glucose, Bld  Date Value Ref Range Status  10/28/2016 99 70 - 99 mg/dL Final    Alcohol: a few drinks a month Tobacco use: former smoker, quit 5-6 years ago pack per day  STD testing and prevention (chl/gon/syphilis): Declines HIV: declines screening  Skin cancer: No concerns today, follows with dermatology routinely for skin checks Colorectal cancer: colonoscopy up to date, follows with [facility] routinely for personal history of colon polyps   Prostate cancer: will update PSA today Lab Results  Component Value Date   PSA 3.47 10/28/2016   IPSS Questionnaire (AUA-7): Over the past month.   1)  How often have you had a sensation of not emptying your bladder completely after you finish urinating?  0 - Not at all  2)  How often have you had to urinate again less than two hours after you finished urinating? 2 - Less than half the time  3)  How often have you found you stopped and started again several times when you urinated?  0 - Not at all  4) How difficult have you found it to postpone urination?  1 - Less than 1 time in 5  5) How often have you had a weak urinary stream?  0 - Not at all  6) How often have you had to push or strain to begin urination?  0 - Not at all  7) How many times did you most typically get up to urinate from the time you went to bed until the  time you got up in the morning?  3 - 3 times  Total score:  6          Lung cancer:  Referral to pulmonology for screening today  Aspirin: taking 81 mg daily ECG:  Not indicated  Vaccinations: up to date  Advanced Care Planning: A voluntary discussion about advance care planning including the explanation and discussion of advance directives.  Discussed health care proxy and Living will, and the patient DOES have a living will at present time. If patient does have living will, I have requested they bring this to the clinic to be scanned in to their chart.  Patient Active Problem List   Diagnosis Date Noted  . Routine health maintenance 10/28/2016  . Heat rash 09/11/2016  . Erectile dysfunction  due to diseases classified elsewhere 09/11/2016  . Snoring 09/11/2016  . Polyp of colon 09/11/2016  . Tinea pedis of right foot 09/11/2016  . Elevated blood pressure reading 09/11/2016  . Obesity 09/11/2016    Past Surgical History:  Procedure Laterality Date  . COLONOSCOPY W/ POLYPECTOMY    . NOSE SURGERY     Septal surgery  . RECTAL SURGERY  05/2017   fistula repair    Family History  Problem Relation Age of Onset  . Breast cancer Mother   . Healthy Maternal Grandmother   . Healthy Maternal Grandfather   . Healthy Paternal Grandmother   . Colon cancer Neg Hx     Social History   Socioeconomic History  . Marital status: Single    Spouse name: Not on file  . Number of children: 3  . Years of education: 37  . Highest education level: Not on file  Occupational History  . Not on file  Social Needs  . Financial resource strain: Not on file  . Food insecurity:    Worry: Not on file    Inability: Not on file  . Transportation needs:    Medical: Not on file    Non-medical: Not on file  Tobacco Use  . Smoking status: Former Smoker    Packs/day: 1.00    Years: 25.00    Pack years: 25.00  . Smokeless tobacco: Never Used  Substance and Sexual Activity  . Alcohol use: Yes    Alcohol/week: 1.2 - 1.8 oz    Types: 2 - 3 Glasses of wine per week  . Drug use: Yes    Types: Marijuana    Comment: 1x every couple of months,FEB 2018  . Sexual activity: Yes  Lifestyle  . Physical activity:    Days per week: Not on file    Minutes per session: Not on file  . Stress: Not on file  Relationships  . Social connections:    Talks on phone: Not on file    Gets together: Not on file    Attends religious service: Not on file    Active member of club or organization: Not on file    Attends meetings of clubs or organizations: Not on file    Relationship status: Not on file  . Intimate partner violence:    Fear of current or ex partner: Not on file    Emotionally abused: Not on  file    Physically abused: Not on file    Forced sexual activity: Not on file  Other Topics Concern  . Not on file  Social History Narrative   Fun/Hobby: Engineer, civil (consulting) sports, hang out with friends; Doctor, general practice; Travel     Current  Outpatient Medications:  .  acetaminophen (TYLENOL) 325 MG tablet, Take 650 mg by mouth every 6 (six) hours as needed., Disp: , Rfl:  .  aspirin EC 81 MG tablet, Take 81 mg by mouth daily., Disp: , Rfl:  .  [employer] SALINE NASAL SPRAY NA, Place into the nose., Disp: , Rfl:  .  desonide (DESOWEN) 0.05 % ointment, , Disp: , Rfl:  .  ketoconazole (NIZORAL) 2 % cream, Apply 1 application topically daily., Disp: 15 g, Rfl: 2 .  sildenafil (REVATIO) 20 MG tablet, Take 1-5 tablets by mouth daily as needed for erectile dysfunction., Disp: 50 tablet, Rfl: 1  Current Facility-Administered Medications:  .  0.9 %  sodium chloride infusion, 500 mL, Intravenous, Continuous, Armbruster, Carlota Raspberry, MD  No Known Allergies   ROS  Constitutional: Negative for fever or weight change.  Respiratory: Negative for cough and shortness of breath.   Cardiovascular: Negative for chest pain or palpitations.  Gastrointestinal: Negative for abdominal pain, no bowel changes.  Musculoskeletal: Negative for gait problem or joint swelling.  Skin: Negative for rash.  Neurological: Negative for dizziness or headache.  No other specific complaints in a complete review of systems (except as listed in HPI above).  Objective  Vitals:   10/30/17 0803  BP: (!) 142/90  Pulse: 80  Resp: 16  Temp: 98 F (36.7 C)  TempSrc: Oral  SpO2: 95%  Weight: 217 lb (98.4 kg)  Height: 5\' 9"  (1.753 m)    Body mass index is 32.05 kg/m.  Physical Exam Vital signs reviewed Constitutional: Patient appears well-developed and well-nourished. No distress.  HENT: Head: Normocephalic and atraumatic. Ears: B TMs ok, no erythema or effusion; scant cerumen bilateral ear canals, Nose: Nose normal. Mouth/Throat:  Oropharynx is clear and moist. No oropharyngeal exudate.  Eyes: Conjunctivae and EOM are normal. Pupils are equal, round, and reactive to light. No scleral icterus.  Neck: Normal range of motion. Neck supple. No cervical adenopathy. No thyromegaly present.  Cardiovascular: Normal rate, regular rhythm and normal heart sounds.  No murmur heard. No BLE edema. Pulmonary/Chest: Effort normal and breath sounds normal. No respiratory distress. Abdominal: Soft. Bowel sounds are normal, no distension. There is no tenderness. no masses Musculoskeletal: Normal range of motion, no joint effusions. No gross deformities Neurological: he is alert and oriented to person, place, and time. No cranial nerve deficit. Coordination, balance, strength, speech and gait are normal.  Skin: Skin is warm and dry.  Psychiatric: Patient has a normal mood and affect. behavior is normal. Judgment and thought content normal.  PHQ2/9: Depression screen [facility] 2/9 10/30/2017 10/28/2016  Decreased Interest 0 0  Down, Depressed, Hopeless 0 0  PHQ - 2 Score 0 0    Fall Risk: Fall Risk  10/30/2017 10/28/2016  Falls in the past year? No No    Assessment & Plan RTC in 1 year for CPE  Elevated blood pressure reading Mildly Elevated reading today and also elevated readings noted in past He would prefer not to take medication at this time but continue to work on healthy diet and exercise which we discussed in depth We also discussed risks of long term uncontrolled BP He would like to continue monitoring at home and F/U for consistent readings >140/90 - CBC; Future - Comprehensive metabolic panel; Future

## 2017-11-03 ENCOUNTER — Other Ambulatory Visit: Payer: Self-pay | Admitting: Nurse Practitioner

## 2017-11-03 DIAGNOSIS — Z122 Encounter for screening for malignant neoplasm of respiratory organs: Secondary | ICD-10-CM

## 2017-11-05 ENCOUNTER — Telehealth: Payer: Self-pay | Admitting: Acute Care

## 2017-11-06 ENCOUNTER — Other Ambulatory Visit: Payer: Self-pay | Admitting: Family

## 2017-11-06 DIAGNOSIS — R972 Elevated prostate specific antigen [PSA]: Secondary | ICD-10-CM

## 2017-11-06 MED ORDER — ATORVASTATIN CALCIUM 20 MG PO TABS: 20.0000 mg | ORAL_TABLET | Freq: Every day | ORAL | 1 refills | Status: DC

## 2017-11-07 ENCOUNTER — Other Ambulatory Visit: Payer: Self-pay | Admitting: Acute Care

## 2017-11-07 ENCOUNTER — Encounter: Payer: Self-pay | Admitting: Acute Care

## 2017-11-07 DIAGNOSIS — Z122 Encounter for screening for malignant neoplasm of respiratory organs: Secondary | ICD-10-CM

## 2017-11-07 DIAGNOSIS — Z87891 Personal history of nicotine dependence: Secondary | ICD-10-CM

## 2017-11-07 NOTE — Telephone Encounter (Signed)
Spoke with pt and scheduled SDMV 12/03/17 9:30 CT ordered Nothing further needed

## 2017-12-03 ENCOUNTER — Ambulatory Visit (INDEPENDENT_AMBULATORY_CARE_PROVIDER_SITE_OTHER): Payer: 59 | Admitting: Acute Care

## 2017-12-03 ENCOUNTER — Encounter: Payer: Self-pay | Admitting: Acute Care

## 2017-12-03 ENCOUNTER — Ambulatory Visit (INDEPENDENT_AMBULATORY_CARE_PROVIDER_SITE_OTHER)
Admission: RE | Admit: 2017-12-03 | Discharge: 2017-12-03 | Disposition: A | Discharge status: Home / Self Care | Payer: 59 | Source: Ambulatory Visit | Attending: Acute Care | Admitting: Acute Care

## 2017-12-03 DIAGNOSIS — Z87891 Personal history of nicotine dependence: Secondary | ICD-10-CM

## 2017-12-03 DIAGNOSIS — Z122 Encounter for screening for malignant neoplasm of respiratory organs: Secondary | ICD-10-CM

## 2017-12-03 NOTE — Progress Notes (Signed)
Shared Decision Making Visit Lung Cancer Screening Program [phone])   Eligibility:  Age 62 y.o.  Pack Years Smoking History Calculation 32 pack year smoking history (# packs/per year x # years smoked)  Recent History of coughing up blood  no  Unexplained weight loss? no ( >Than 15 pounds within the last 6 months )  Prior History Lung / other cancer no (Diagnosis within the last 5 years already requiring surveillance chest CT Scans).  Smoking Status Former Smoker  Former Smokers: Years since quit: 7 years  Quit Date: 08/05/2010  Visit Components:  Discussion included one or more decision making aids. yes  Discussion included risk/benefits of screening. yes  Discussion included potential follow up diagnostic testing for abnormal scans. yes  Discussion included meaning and risk of over diagnosis. Yes  Discussion included meaning and risk of False Positives. yes  Discussion included meaning of total radiation exposure. yes  Counseling Included:  Importance of adherence to annual lung cancer LDCT screening. yes  Impact of comorbidities on ability to participate in the program. yes  Ability and willingness to under diagnostic treatment. yes  Smoking Cessation Counseling:  Current Smokers:   Discussed importance of smoking cessation. NA. Former smoker  Information about tobacco cessation classes and interventions provided to patient. yes  Patient provided with "ticket" for LDCT Scan. yes  Symptomatic Patient. no  Counseling  Diagnosis Code: Tobacco Use Z72.0  Asymptomatic Patient yes  Counseling (Intermediate counseling: > three minutes counseling) N6295  Former Smokers:   Discussed the importance of maintaining cigarette abstinence. yes  Diagnosis Code: Personal History of Nicotine Dependence. M84.132  Information about tobacco cessation classes and interventions provided to patient. Yes  Patient provided with "ticket" for LDCT Scan. yes  Written Order  for Lung Cancer Screening with LDCT placed in Epic. Yes (CT Chest Lung Cancer Screening Low Dose W/O CM) [id number] Z12.2-Screening of respiratory organs Z87.891-Personal history of nicotine dependence  I spent 25 minutes of face to face time with Mr. Kishi discussing the risks and benefits of lung cancer screening. We viewed a power point together that explained in detail the above noted topics. We took the time to pause the power point at intervals to allow for questions to be asked and answered to ensure understanding. We discussed that he had taken the single most powerful action possible to decrease his risk of developing lung cancer when he quit smoking. I counseled him to remain smoke free, and to contact me if he ever had the desire to smoke again so that I can provide resources and tools to help support the effort to remain smoke free. We discussed the time and location of the scan, and that either  Doroteo Glassman RN or I will call with the results within  24-48 hours of receiving them. He has my card and contact information in the event he needs to speak with me, in addition to a copy of the power point we reviewed as a resource. He verbalized understanding of all of the above and had no further questions upon leaving the office.     I explained to the patient that there has been a high incidence of coronary artery disease noted on these exams. I explained that this is a non-gated exam therefore degree or severity cannot be determined. This patient is not on statin therapy. I have asked the patient to follow-up with their PCP regarding any incidental finding of coronary artery disease and management with diet or medication as they feel  is clinically indicated. The patient verbalized understanding of the above and had no further questions.     Magdalen Spatz, NP 12/03/2017 9:55 AM

## 2017-12-12 ENCOUNTER — Other Ambulatory Visit: Payer: Self-pay | Admitting: Acute Care

## 2017-12-12 DIAGNOSIS — Z87891 Personal history of nicotine dependence: Secondary | ICD-10-CM

## 2017-12-12 DIAGNOSIS — Z122 Encounter for screening for malignant neoplasm of respiratory organs: Secondary | ICD-10-CM

## 2018-01-01 ENCOUNTER — Ambulatory Visit (INDEPENDENT_AMBULATORY_CARE_PROVIDER_SITE_OTHER): Payer: 59 | Admitting: Neurology

## 2018-01-01 ENCOUNTER — Encounter: Payer: Self-pay | Admitting: Neurology

## 2018-01-01 VITALS — BP 140/93 | HR 74 | Ht 69.5 in | Wt 215.0 lb

## 2018-01-01 DIAGNOSIS — Z9989 Dependence on other enabling machines and devices: Secondary | ICD-10-CM

## 2018-01-01 DIAGNOSIS — G4733 Obstructive sleep apnea (adult) (pediatric): Secondary | ICD-10-CM

## 2018-01-01 NOTE — Progress Notes (Signed)
ONO order faxed to [facility]. Reminded pt to use ONO while on auto pap. Pt verbalized understanding. Received a receipt of confirmation.

## 2018-01-01 NOTE — Patient Instructions (Addendum)
Please continue using your autoPAP regularly. While your insurance requires that you use PAP at least 4 hours each night on 70% of the nights, I recommend, that you not skip any nights and use it throughout the night if you can. Getting used to PAP and staying with the treatment long term does take time and patience and discipline. Untreated obstructive sleep apnea when it is moderate to severe can have an adverse impact on cardiovascular health and raise her risk for heart disease, arrhythmias, hypertension, congestive heart failure, stroke and diabetes. Untreated obstructive sleep apnea causes sleep disruption, nonrestorative sleep, and sleep deprivation. This can have an impact on your day to day functioning and cause daytime sleepiness and impairment of cognitive function, memory loss, mood disturbance, and problems focussing. Using PAP regularly can improve these symptoms.  Good job with your autoPAP usage thus far!  As discussed, we will do an overnight oxygen level test, called ONO, and your DME company will call and set this up for one night, while you also use your autoPAP as usual. We will call you with the results. This is to make sure that your oxygen levels stay in the 90s, while you are treated with autoPAP for your OSA. Remember, your oxygen levels dropped into the 70s during the home sleep test.   Please also ask [facility] to provide you with a chinstrap and find out if you need to use it over or under the mask.  Also, address the leak of air.

## 2018-01-01 NOTE — Progress Notes (Signed)
Subjective:    Patient ID: Elijah Robertson is a 62 y.o. male.  HPI     Interim history:   Elijah Robertson is a 62 year old right-handed gentleman with an underlying medical history of childhood seizures, status post septoplasty, reflux disease and borderline obesity, who presents for follow-up consultation of his obstructive sleep apnea, after sleep testing at home and starting AutoPap therapy. Patient is unaccompanied today. I first met him on 09/10/2017 at the request of his primary care provider, at which time he reported snoring and daytime somnolence. I suggested we proceed with sleep study testing. His insurance denied an attended sleep study. He had a home sleep test on 09/29/2017 which indicated severe sleep apnea based on an AHI of 50.3 per hour, average oxygen saturation of 92%, nadir of 75% with significant time below or at 88% saturation of 38 minutes. Unfortunately, his insurance denied a lab attended sleep study and he was therefore advised to proceed with AutoPap therapy at home.  Today, 01/01/2018: I reviewed his AutoPap compliance data from 12/01/2017 through 12/30/2017 which is a total of 30 days, during which time he used his AutoPap every night with percent used days greater than 4 hours at 90%, indicating excellent compliance with an average usage of 6 hours and 22 minutes, residual AHI borderline at 6.9 per hour, 95th percentile of pressure at 13.8 cm, leak on the higher end with the 95th percentile at 18.9 L/m on a pressure range of 7 cm to 16 cm with EPR. He reports doing okay, uses a mouth guard with the autoPAP. Wonders if he needs a FFM. He has used a mouth guard for tooth grinding for years. Overall he is quite pleased with how he is doing, he feels better rested, left bathroom breaks at night, sleep more deeper. He is planning a three-week trip to [place] to work and also attend his daughter's wedding. He plans to take his machine with him. He has increase the humidity but  sometimes there is condensation in the tubing. He would be willing to try a chinstrap and also try using his CPAP without a mouth guard for a little bit to see if it makes a difference. He uses a med N30i nasal cradle interface.  Previously:   09/10/2017: (He) reports snoring and excessive daytime somnolence. Epworth sleepiness score is 8 out of 24, fatigue score is 20/63. He lives alone, is divorced, has a girlfriend who lives in [place]. He works in Engineer, technical sales. He has 3 children, all in [place]. He quit smoking in 2012 and drinks alcohol socially, smokes marijuana occasionally, caffeine in the form of coffee, 2-3 cups a day. He reports a bedtime between 10 and 11 typically, rising between 6 and 6:30. He denies telltale symptoms of restless leg syndrome. His girlfriend has noticed breathing pauses while he is asleep and very loud snoring at times, disturbing enough to not be able to sleep in the same bedroom. He is not aware of any family history of OSA. He would be willing to consider CPAP therapy. He has occasional morning headaches, he has had nocturia about 2-3 times per average night. He headaches are mild, not typically enough to take medication for this.  His Past Medical History Is Significant For: Past Medical History:  Diagnosis Date  . Diverticulosis   . GERD (gastroesophageal reflux disease)   . Hemorrhoids   . Perianal fistula   . Seizures ([place])    Childhood - none in adulthood  . Sleep apnea  His Past Surgical History Is Significant For: Past Surgical History:  Procedure Laterality Date  . COLONOSCOPY W/ POLYPECTOMY    . NOSE SURGERY     Septal surgery  . RECTAL SURGERY  05/2017   fistula repair    His Family History Is Significant For: Family History  Problem Relation Age of Onset  . Breast cancer Mother   . Healthy Maternal Grandmother   . Healthy Maternal Grandfather   . Healthy Paternal Grandmother   . Colon cancer Neg Hx     His Social History Is Significant  For: Social History   Socioeconomic History  . Marital status: Single    Spouse name: Not on file  . Number of children: 3  . Years of education: 5  . Highest education level: Not on file  Occupational History  . Not on file  Social Needs  . Financial resource strain: Not on file  . Food insecurity:    Worry: Not on file    Inability: Not on file  . Transportation needs:    Medical: Not on file    Non-medical: Not on file  Tobacco Use  . Smoking status: Former Smoker    Packs/day: 1.00    Years: 32.00    Pack years: 32.00    Types: Cigarettes    Last attempt to quit: 08/05/2010    Years since quitting: 7.4  . Smokeless tobacco: Never Used  Substance and Sexual Activity  . Alcohol use: Yes    Alcohol/week: 1.2 - 1.8 oz    Types: 2 - 3 Glasses of wine per week  . Drug use: Yes    Types: Marijuana    Comment: 1x every couple of months,FEB 2018  . Sexual activity: Yes  Lifestyle  . Physical activity:    Days per week: Not on file    Minutes per session: Not on file  . Stress: Not on file  Relationships  . Social connections:    Talks on phone: Not on file    Gets together: Not on file    Attends religious service: Not on file    Active member of club or organization: Not on file    Attends meetings of clubs or organizations: Not on file    Relationship status: Not on file  Other Topics Concern  . Not on file  Social History Narrative   Fun/Hobby: Engineer, civil (consulting) sports, hang out with friends; Backyard BBQ; Travel    His Allergies Are:  No Known Allergies:   His Current Medications Are:  Outpatient Encounter Medications as of 01/01/2018  Medication Sig  . acetaminophen (TYLENOL) 325 MG tablet Take 650 mg by mouth every 6 (six) hours as needed.  Marland Kitchen aspirin EC 81 MG tablet Take 81 mg by mouth daily.  Marland Kitchen atorvastatin (LIPITOR) 20 MG tablet Take 1 tablet (20 mg total) by mouth daily.  . [employer] SALINE NASAL SPRAY NA Place into the nose.  Marland Kitchen desonide (DESOWEN) 0.05 % ointment    . ketoconazole (NIZORAL) 2 % cream Apply 1 application topically daily.  . sildenafil (REVATIO) 20 MG tablet Take 1-5 tablets by mouth daily as needed for erectile dysfunction.   Facility-Administered Encounter Medications as of 01/01/2018  Medication  . 0.9 %  sodium chloride infusion  :  Review of Systems:  Out of a complete 14 point review of systems, all are reviewed and negative with the exception of these symptoms as listed below: Review of Systems  Neurological:  Pt presents today to discuss his cpap. Pt wants to know if he needs a FFM. Pt uses a mouth guard at night as well.    Objective:  Neurological Exam  Physical Exam Physical Examination:   Vitals:   01/01/18 0822  BP: (!) 140/93  Pulse: 74   General Examination: The patient is a very pleasant 62 y.o. male in no acute distress. He appears well-developed and well-nourished and well groomed.    HEENT: Normocephalic, atraumatic, pupils are equal, round and reactive to light and accommodation. Extraocular tracking is good without limitation to gaze excursion or nystagmus noted. Normal smooth pursuit is noted. Hearing is grossly intact. Face is symmetric with normal facial animation and normal facial sensation. Speech is clear with no dysarthria noted. There is no hypophonia. There is no lip, neck/head, jaw or voice tremor. Neck is supple with full range of passive and active motion. There are no carotid bruits on auscultation. Oropharynx exam reveals: mild mouth dryness, adequate dental hygiene and marked airway crowding. Mallampati is class II. Tongue protrudes centrally and palate elevates symmetrically.    Chest: Clear to auscultation without wheezing, rhonchi or crackles noted.  Heart: S1+S2+0, regular and normal without murmurs, rubs or gallops noted.   Abdomen: Soft, non-tender and non-distended with normal bowel sounds appreciated on auscultation.  Extremities: There is no pitting edema in the distal  lower extremities bilaterally. Pedal pulses are intact.  Skin: Warm and dry without trophic changes noted.  Musculoskeletal: exam reveals no obvious joint deformities, tenderness or joint swelling or erythema.   Neurologically:  Mental status: The patient is awake, alert and oriented in all 4 spheres. His immediate and remote memory, attention, language skills and fund of knowledge are appropriate. There is no evidence of aphasia, agnosia, apraxia or anomia. Speech is clear with normal prosody and enunciation. Thought process is linear. Mood is normal and affect is normal.  Cranial nerves II - XII are as described above under HEENT exam.  Motor exam: Normal bulk, strength and tone is noted. There is no tremor. Fine motor skills and coordination: grossly intact.  Cerebellar testing: No dysmetria or intention tremor. There is no truncal or gait ataxia.  Sensory exam: intact to light touch in the upper and lower extremities.  Gait, station and balance: He stands easily. No veering to one side is noted. No leaning to one side is noted. Posture is age-appropriate and stance is narrow based. Gait shows normal stride length and pace.                 Assessment and Plan:   In summary, Elijah Robertson is a very pleasant 62 year old male with an underlying medical history of childhood seizures, status post septoplasty, reflux disease and borderline obesity, who presents for FU consultation of his obstructive sleep apnea (OSA), which was determined to be in the severe range via home sleep testing on 09/29/17. He has established treatment with autoPAP. He is compliant with his treatment and is commended for this. He has noted improvement in his sleep consolidation, improvement of daytime tiredness and improvement in his nocturia. He is advised to try a chinstrap and also try to use his AutoPap without a mouth guard for a little while to see if his mouth dryness and air leak improves. He does not have to  completely discontinue using his mouth guard but sometimes when sleep apnea is treated other issues such as bruxism can improve. Due to O2 desaturations in the 70s, I would  like to do an overnight oxygen level test, ONO, through his DME company, while he is using his autoPAP to ensure his O2 sats are adequate on the current treatment. We will call him with the results. He may not be able to do this before his prescription [place] which is coming up soon. I suggested he can certainly do this after he is back from [place]. He is encouraged to continue to be fully compliant with sleep apnea treatment as he has severe sleep apnea. Since he is doing well he can follow-up in 6 months, he can see one of our nurse practitioners at the time and hopefully if he continues to do well he can be seen on a yearly basis. He plans to retire at age 22 and may move back to [place] after that. I spent 25 minutes in total face-to-face time with the patient, more than 50% of which was spent in counseling and coordination of care, reviewing test results, reviewing medication and discussing or reviewing the diagnosis of OSA, its prognosis and treatment options. Pertinent laboratory and imaging test results that were available during this visit with the patient were reviewed by me and considered in my medical decision making (see chart for details).

## 2018-01-07 ENCOUNTER — Encounter: Payer: Self-pay | Admitting: Neurology

## 2018-01-15 ENCOUNTER — Telehealth: Payer: Self-pay | Admitting: Neurology

## 2018-01-15 DIAGNOSIS — G4733 Obstructive sleep apnea (adult) (pediatric): Secondary | ICD-10-CM

## 2018-01-15 DIAGNOSIS — Z9989 Dependence on other enabling machines and devices: Secondary | ICD-10-CM

## 2018-01-15 DIAGNOSIS — G4734 Idiopathic sleep related nonobstructive alveolar hypoventilation: Secondary | ICD-10-CM

## 2018-01-15 NOTE — Telephone Encounter (Signed)
I reviewed his recent overnight pulse oximetry test result from 01/06/2018. Total test time was 7 hours and 22 minutes, he was using his AutoPap while on room air. Average oxygen saturation was 92%, nadir was 83% of time below or at 88% saturation was nearly 7 minutes. This is not a critical finding but still not normal. This means that we may have to bring him in for a full night CPAP titration study to optimize his treatment settings and monitor his oxygen saturations more accurately. I will request a full night CPAP titration study and we will submit to his insurance. Please notify patient.

## 2018-01-16 NOTE — Telephone Encounter (Signed)
Pt returned my call. I explained his ONO results. Pt is agreeable to completing an in lab titration study and understands that our sleep lab will check with his insurance and let him know of the outcome. Pt verbalized understanding of results. Pt had no questions at this time but was encouraged to call back if questions arise.

## 2018-01-16 NOTE — Telephone Encounter (Signed)
I called pt to discuss his ONO results. No answer, left a message asking him to call me back. 

## 2018-02-08 ENCOUNTER — Ambulatory Visit (INDEPENDENT_AMBULATORY_CARE_PROVIDER_SITE_OTHER): Payer: 59

## 2018-02-08 ENCOUNTER — Encounter (HOSPITAL_COMMUNITY): Payer: Self-pay | Admitting: Emergency Medicine

## 2018-02-08 ENCOUNTER — Ambulatory Visit (HOSPITAL_COMMUNITY)
Admission: EM | Admit: 2018-02-08 | Discharge: 2018-02-08 | Disposition: A | Discharge status: Home / Self Care | Payer: 59 | Attending: Family Medicine | Admitting: Family Medicine

## 2018-02-08 DIAGNOSIS — M7989 Other specified soft tissue disorders: Secondary | ICD-10-CM

## 2018-02-08 DIAGNOSIS — S80812A Abrasion, left lower leg, initial encounter: Secondary | ICD-10-CM

## 2018-02-08 MED ORDER — MUPIROCIN 2 % EX OINT: 1.0000 "application " | TOPICAL_OINTMENT | Freq: Two times a day (BID) | CUTANEOUS | 0 refills | Status: DC

## 2018-02-08 NOTE — Discharge Instructions (Signed)
No alarming signs on exam.  Your history and exam with low suspicion of blood clots.  Swelling could be due to abrasion to the side of your leg.  Keep leg elevated, compression can help decrease swelling.  No signs of infection to the wound today.  You can use Bactroban ointment when dressing the wound.  Otherwise leave the wound open, clean and dry.  He can clean the wound with soap and water.  Monitor for spreading redness, increased warmth, fever, follow-up for reevaluation.

## 2018-02-08 NOTE — ED Triage Notes (Signed)
Pt states he stepped through a grate on Wednesday, has significant swelling to L foot and scrap on L side of leg.

## 2018-02-08 NOTE — ED Provider Notes (Signed)
[facility]    CSN: [id number] Arrival date & time: 02/08/18  1533     History   Chief Complaint Chief Complaint  Patient presents with  . Appointment    341  . Foot Injury    HPI Elijah Robertson is a 62 y.o. male.   62 year old male comes in for 3-day history of left foot swelling.  States 5 days ago, stepped through a great, and caused abrasion to the left lateral leg.  He has been able to ambulate with mild pain.  3 days ago started noticing swelling to the foot and ankle.  Denies erythema, increased warmth.  States flew back from [place] today with 4-hour flight to [place], then 1 hour flight to [place].  He denies any calf pain.  Denies chest pain, shortness of breath, palpitation. Denies personal history of blood clots. States now more with numbness/tingling to the foot.     Past Medical History:  Diagnosis Date  . Diverticulosis   . GERD (gastroesophageal reflux disease)   . Hemorrhoids   . Perianal fistula   . Seizures ([place])    Childhood - none in adulthood  . Sleep apnea     Patient Active Problem List   Diagnosis Date Noted  . Routine health maintenance 10/28/2016  . Erectile dysfunction due to diseases classified elsewhere 09/11/2016  . Snoring 09/11/2016  . Polyp of colon 09/11/2016  . Elevated blood pressure reading 09/11/2016  . Obesity 09/11/2016    Past Surgical History:  Procedure Laterality Date  . COLONOSCOPY W/ POLYPECTOMY    . NOSE SURGERY     Septal surgery  . RECTAL SURGERY  05/2017   fistula repair       Home Medications    Prior to Admission medications   Medication Sig Start Date End Date Taking? Authorizing Provider  acetaminophen (TYLENOL) 325 MG tablet Take 650 mg by mouth every 6 (six) hours as needed.    [provider]  aspirin EC 81 MG tablet Take 81 mg by mouth daily.    [provider]  atorvastatin (LIPITOR) 20 MG tablet Take 1 tablet (20 mg total) by mouth daily. Patient not  taking: Reported on 02/08/2018 11/06/17   Marrian Salvage, FNP  [facility] SALINE NASAL SPRAY NA Place into the nose.    [provider]  desonide (DESOWEN) 0.05 % ointment  12/11/16   [provider]  ketoconazole (NIZORAL) 2 % cream Apply 1 application topically daily. 09/11/16   Golden Circle, FNP  mupirocin ointment (BACTROBAN) 2 % Apply 1 application topically 2 (two) times daily. 02/08/18   Tasia Catchings, Amy V, PA-C  sildenafil (REVATIO) 20 MG tablet Take 1-5 tablets by mouth daily as needed for erectile dysfunction. 10/30/17   Lance Sell, NP    Family History Family History  Problem Relation Age of Onset  . Breast cancer Mother   . Healthy Maternal Grandmother   . Healthy Maternal Grandfather   . Healthy Paternal Grandmother   . Colon cancer Neg Hx     Social History Social History   Tobacco Use  . Smoking status: Former Smoker    Packs/day: 1.00    Years: 32.00    Pack years: 32.00    Types: Cigarettes    Last attempt to quit: 08/05/2010    Years since quitting: 7.5  . Smokeless tobacco: Never Used  Substance Use Topics  . Alcohol use: Yes    Alcohol/week: 1.2 - 1.8 oz  Types: 2 - 3 Glasses of wine per week  . Drug use: Yes    Types: Marijuana    Comment: 1x every couple of months,FEB 2018     Allergies   Patient has no known allergies.   Review of Systems Review of Systems  Reason unable to perform ROS: See HPI as above.     Physical Exam Triage Vital Signs ED Triage Vitals [02/08/18 1546]  Enc Vitals Group     BP (!) 162/86     Pulse Rate 89     Resp 18     Temp 97.9 F (36.6 C)     Temp src      SpO2 100 %     Weight      Height      Head Circumference      Peak Flow      Pain Score      Pain Loc      Pain Edu?      Excl. in [place]?    No data found.  Updated Vital Signs BP (!) 162/86   Pulse 89   Temp 97.9 F (36.6 C)   Resp 18   SpO2 100%   Physical Exam  Constitutional: He is oriented to person, place, and time. He  appears well-developed and well-nourished. No distress.  HENT:  Head: Normocephalic and atraumatic.  Eyes: Pupils are equal, round, and reactive to light. Conjunctivae are normal.  Cardiovascular: Normal rate, regular rhythm and normal heart sounds. Exam reveals no gallop and no friction rub.  No murmur heard. Pulmonary/Chest: Effort normal and breath sounds normal. No stridor. No respiratory distress. He has no wheezes. He has no rales.  Musculoskeletal:  See picture below for abrasion.  Swelling to the left foot and ankle.  Contusion to the dorsal aspect of left distal MTPs.  2-3+ pitting edema of the foot and ankle.  No significant tenderness to palpation of the foot and ankle.  Full range of motion.  Strength normal and equal bilaterally.  Sensation intact and equal bilaterally.  Pedal pulse 2+ and equal bilaterally.  No tenderness to palpation of calf.  Negative Homans.  Neurological: He is alert and oriented to person, place, and time.  Skin: He is not diaphoretic.         UC Treatments / Results  Labs (all labs ordered are listed, but only abnormal results are displayed) Labs Reviewed - No data to display  EKG None  Radiology Dg Foot Complete Left  Result Date: 02/08/2018 CLINICAL DATA:  Swelling and contusion LEFT foot, injured stepping into a heating/AC return vent in the floor on 02/04/2018, bruising, abrasion, swelling, tightness EXAM: LEFT FOOT - COMPLETE 3+ VIEW COMPARISON:  None FINDINGS: Osseous mineralization normal. Joint spaces preserved. No acute fracture, dislocation, or bone destruction. Scattered soft tissue swelling greatest over the dorsum of the metatarsals. IMPRESSION: No acute osseous abnormalities. Electronically Signed   By: Lavonia Dana M.D.   On: 02/08/2018 17:10    Procedures Procedures (including critical care time)  Medications Ordered in UC Medications - No data to display  Initial Impression / Assessment and Plan / UC Course  I have reviewed  the triage vital signs and the nursing notes.  Pertinent labs & imaging results that were available during my care of the patient were reviewed by me and considered in my medical decision making (see chart for details).    Xray negative. Discussed case with Dr Meda Coffee. Given swelling occurred prior to plane  flight, without erythema/warmth/pain, negative homans, low suspicion for DVT right now. Will have patient elevate foot with compression to help with the swelling and monitor. Wound care instructions provided. Return precautions given. Patient expresses understanding and agrees to plan.  Final Clinical Impressions(s) / UC Diagnoses   Final diagnoses:  Abrasion of left lower leg, initial encounter  Swelling of left foot   ED Prescriptions    Medication Sig Dispense Auth. Provider   mupirocin ointment (BACTROBAN) 2 % Apply 1 application topically 2 (two) times daily. 22 g Tobin Chad, [place] 02/08/18 1900

## 2018-02-19 ENCOUNTER — Encounter: Payer: Self-pay | Admitting: Nurse Practitioner

## 2018-02-19 ENCOUNTER — Encounter: Payer: Self-pay | Admitting: Internal Medicine

## 2018-02-19 ENCOUNTER — Ambulatory Visit (INDEPENDENT_AMBULATORY_CARE_PROVIDER_SITE_OTHER): Payer: 59 | Admitting: Internal Medicine

## 2018-02-19 DIAGNOSIS — R21 Rash and other nonspecific skin eruption: Secondary | ICD-10-CM

## 2018-02-19 MED ORDER — PREDNISONE 20 MG PO TABS: 40.0000 mg | ORAL_TABLET | Freq: Every day | ORAL | 0 refills | Status: DC

## 2018-02-19 NOTE — Assessment & Plan Note (Signed)
Rx for prednisone. Due to global rash cream not prescribed. Asked to stop turmeric. Can take benadryl over the counter and zyrtec to help with allergic reaction.

## 2018-02-19 NOTE — Patient Instructions (Signed)
We have sent in prednisone to take 2 pills daily for 4 days and then stop.  I would recommend to stop the turmeric.   You can take benadyrl for itching.   You can also take zyrtec to help reduce itching 1 pill twice a day.

## 2018-02-19 NOTE — Progress Notes (Signed)
   Subjective:    Patient ID: Elijah Robertson, male    DOB: [birth year], 62 y.o.   MRN: [id number]  HPI The patient is a 62 YO man coming in for swelling, rash and itching. Started about 1-2 days ago. Started taking turmeric about 1 week ago. Denies other changes. No outside yard work. Denies new soap, lotion, shampoo, detergent. Is having rash started on his stomach and now is on arms, legs, and stomach. No rash on face. Some swelling of his eyelids. Denies mouth or throat swelling. No problems eating this morning. No nausea or vomiting. No diarrhea.   Review of Systems  Constitutional: Negative.   HENT: Negative.   Eyes: Negative.        Eyelid swelling  Respiratory: Negative for cough, chest tightness and shortness of breath.   Cardiovascular: Negative for chest pain, palpitations and leg swelling.  Gastrointestinal: Negative for abdominal distention, abdominal pain, constipation, diarrhea, nausea and vomiting.  Musculoskeletal: Negative.   Skin: Positive for rash.  Neurological: Negative.   Psychiatric/Behavioral: Negative.       Objective:   Physical Exam  Constitutional: He is oriented to person, place, and time. He appears well-developed and well-nourished.  HENT:  Head: Normocephalic and atraumatic.  No oropharynx or tongue swelling  Eyes: EOM are normal.  Neck: Normal range of motion.  Cardiovascular: Normal rate and regular rhythm.  Pulmonary/Chest: Effort normal and breath sounds normal. No respiratory distress. He has no wheezes. He has no rales.  Abdominal: Soft. Bowel sounds are normal. He exhibits no distension. There is no tenderness. There is no rebound.  Musculoskeletal: He exhibits edema.  Swelling in the hands, swollen right lower eyelid  Neurological: He is alert and oriented to person, place, and time. Coordination normal.  Skin: Skin is warm and dry. Rash noted.  Macular patchy rash on the stomach, arms, legs bilateral, no rash on the face, no rash on the  palms of hands, blanchable  Psychiatric: He has a normal mood and affect.   Vitals:   02/19/18 1047  BP: 126/80  Pulse: (!) 126  Temp: (!) 97.5 F (36.4 C)  TempSrc: Oral  SpO2: 97%  Weight: 216 lb (98 kg)  Height: 5' 9.5" (1.765 m)      Assessment & Plan:

## 2018-02-21 ENCOUNTER — Ambulatory Visit (HOSPITAL_COMMUNITY)
Admission: EM | Admit: 2018-02-21 | Discharge: 2018-02-21 | Disposition: A | Discharge status: Home / Self Care | Payer: 59 | Attending: Family Medicine | Admitting: Family Medicine

## 2018-02-21 ENCOUNTER — Other Ambulatory Visit: Payer: Self-pay

## 2018-02-21 ENCOUNTER — Encounter (HOSPITAL_COMMUNITY): Payer: Self-pay | Admitting: *Deleted

## 2018-02-21 DIAGNOSIS — T783XXA Angioneurotic edema, initial encounter: Secondary | ICD-10-CM

## 2018-02-21 DIAGNOSIS — L500 Allergic urticaria: Secondary | ICD-10-CM

## 2018-02-21 DIAGNOSIS — T50905A Adverse effect of unspecified drugs, medicaments and biological substances, initial encounter: Secondary | ICD-10-CM

## 2018-02-21 MED ORDER — EPINEPHRINE 0.3 MG/0.3ML IJ SOAJ
0.3000 mg | Freq: Once | INTRAMUSCULAR | Status: AC
  Administered 2018-02-21: 0.3 mg via INTRAMUSCULAR

## 2018-02-21 MED ORDER — EPINEPHRINE PF 1 MG/ML IJ SOLN
INTRAMUSCULAR | Status: AC
  Filled 2018-02-21: qty 1

## 2018-02-21 MED ORDER — DESLORATADINE 5 MG PO TABS: 5.0000 mg | ORAL_TABLET | Freq: Every day | ORAL | 0 refills | Status: DC

## 2018-02-21 MED ORDER — EPINEPHRINE 0.3 MG/0.3ML IJ SOAJ: 0.3000 mg | Freq: Once | INTRAMUSCULAR | 0 refills | Status: AC | PRN

## 2018-02-21 NOTE — ED Provider Notes (Addendum)
[facility]    CSN: [id number] Arrival date & time: 02/21/18  1413     History   Chief Complaint No chief complaint on file.   HPI Elijah Robertson is a 62 y.o. male.   HPI Skin Rash/Lip swelling: He started high dose of Tumeric 6 days ago, 3 days later, he developed generalized skin rash. He was seen by his PCP who started him on Prednisone and he has been taking it. This morning, he woke up with a swollen lip. 3 days prior, he had hand swelling associated with the rash but the hand swelling has improved. He denies any tongue swelling, no SOB,denies choking, he feels stress and dizzy. No fever. He feels well otherwise. He stopped the prednisone, since he though he is reacting to it, he took Benadryl prior to his arrival about an hour ago. He stated that his tongue swelling has gone down a whole lot and he feels better now.     Past Medical History:  Diagnosis Date  . Diverticulosis   . GERD (gastroesophageal reflux disease)   . Hemorrhoids   . Perianal fistula   . Seizures ([place])    Childhood - none in adulthood  . Sleep apnea     Patient Active Problem List   Diagnosis Date Noted  . Rash 02/19/2018  . Routine health maintenance 10/28/2016  . Erectile dysfunction due to diseases classified elsewhere 09/11/2016  . Snoring 09/11/2016  . Polyp of colon 09/11/2016  . Elevated blood pressure reading 09/11/2016  . Obesity 09/11/2016    Past Surgical History:  Procedure Laterality Date  . COLONOSCOPY W/ POLYPECTOMY    . NOSE SURGERY     Septal surgery  . RECTAL SURGERY  05/2017   fistula repair       Home Medications    Prior to Admission medications   Medication Sig Start Date End Date Taking? Authorizing Provider  acetaminophen (TYLENOL) 325 MG tablet Take 650 mg by mouth every 6 (six) hours as needed.    [provider]  aspirin EC 81 MG tablet Take 81 mg by mouth daily.    [provider]  atorvastatin (LIPITOR) 20 MG tablet Take  1 tablet (20 mg total) by mouth daily. Patient not taking: Reported on 02/08/2018 11/06/17   Marrian Salvage, FNP  [facility] SALINE NASAL SPRAY NA Place into the nose.    [provider]  desonide (DESOWEN) 0.05 % ointment  12/11/16   [provider]  ketoconazole (NIZORAL) 2 % cream Apply 1 application topically daily. 09/11/16   Golden Circle, FNP  mupirocin ointment (BACTROBAN) 2 % Apply 1 application topically 2 (two) times daily. 02/08/18   Tasia Catchings, Amy V, PA-C  predniSONE (DELTASONE) 20 MG tablet Take 2 tablets (40 mg total) by mouth daily with breakfast. 02/19/18   Hoyt Koch, MD  sildenafil (REVATIO) 20 MG tablet Take 1-5 tablets by mouth daily as needed for erectile dysfunction. 10/30/17   Lance Sell, NP    Family History Family History  Problem Relation Age of Onset  . Breast cancer Mother   . Healthy Maternal Grandmother   . Healthy Maternal Grandfather   . Healthy Paternal Grandmother   . Colon cancer Neg Hx     Social History Social History   Tobacco Use  . Smoking status: Former Smoker    Packs/day: 1.00    Years: 32.00    Pack years: 32.00    Types: Cigarettes    Last attempt  to quit: 08/05/2010    Years since quitting: 7.5  . Smokeless tobacco: Never Used  Substance Use Topics  . Alcohol use: Yes    Alcohol/week: 1.2 - 1.8 oz    Types: 2 - 3 Glasses of wine per week  . Drug use: Yes    Types: Marijuana    Comment: 1x every couple of months,FEB 2018     Allergies   Patient has no known allergies.   Review of Systems Review of Systems  HENT:       Lower lip swelling  Respiratory: Negative.   Cardiovascular: Negative.   Gastrointestinal: Negative.   Genitourinary: Negative.   All other systems reviewed and are negative.    Physical Exam Triage Vital Signs ED Triage Vitals  Enc Vitals Group     BP      Pulse      Resp      Temp      Temp src      SpO2      Weight      Height      Head Circumference      Peak  Flow      Pain Score      Pain Loc      Pain Edu?      Excl. in [place]?    No data found.  Updated Vital Signs There were no vitals taken for this visit.  Visual Acuity Right Eye Distance:   Left Eye Distance:   Bilateral Distance:    Right Eye Near:   Left Eye Near:    Bilateral Near:     Physical Exam  Constitutional: He appears well-developed. No distress.  HENT:  Head: Normocephalic.  Right Ear: Tympanic membrane, external ear and ear canal normal.  Left Ear: Tympanic membrane, external ear and ear canal normal.  Mouth/Throat: Uvula is midline, oropharynx is clear and moist and mucous membranes are normal. No oropharyngeal exudate.    Eyes: Pupils are equal, round, and reactive to light. Conjunctivae and EOM are normal. No scleral icterus.  Neck: Normal range of motion.  Cardiovascular: Normal rate and regular rhythm.  No murmur heard. Pulmonary/Chest: Effort normal and breath sounds normal. No stridor. No respiratory distress. He has no decreased breath sounds. He has no wheezes. He has no rhonchi. He has no rales.  Abdominal: Soft. Bowel sounds are normal. He exhibits no distension and no mass. There is no tenderness.  Lymphadenopathy:    He has no cervical adenopathy.  Skin: Lesion and rash noted. Rash is urticarial.     Nursing note and vitals reviewed.              UC Treatments / Results  Labs (all labs ordered are listed, but only abnormal results are displayed) Labs Reviewed - No data to display  EKG None  Radiology No results found.  Procedures Procedures (including critical care time)  Medications Ordered in UC Medications - No data to display  Initial Impression / Assessment and Plan / UC Course  I have reviewed the triage vital signs and the nursing notes.  Pertinent labs & imaging results that were available during my care of the patient were reviewed by me and considered in my medical decision making (see chart for  details).  Clinical Course as of Feb 21 1514  Sat Feb 21, 2018  1502 Unclear if he is solely reacting to the high dose tumeric vs oral prednisone. My guess is that he is reacting to  the Tumeric. I will avoid IM Solumedrol for now. I encouraged ED monitoring and he could potentially get solumedrol shot in the ED with monitoring. Patient hesitant to go to the ED. He stated that he will think about it. IM Epi injection given today. Start Clarinex daily for 14 days. I gave him Epipen refill. Hold prednisone for now. F/U with PCP in two days for reassessment. If no improvement, may try topical steroid. Return precaution discussed.   [KE]  1514 We watched him for another 20 min after EpiPen injection and he did well without any reaction or headache.  He still does not want to go to the ED.   [KE]    Clinical Course User Index [KE] Kinnie Feil, MD    Urticaria due to drug allergy  Angioedema, initial encounter   Final Clinical Impressions(s) / UC Diagnoses   Final diagnoses:  None   Discharge Instructions   None    ED Prescriptions    None     Controlled Substance Prescriptions  Controlled Substance Registry consulted? Not Applicable   Kinnie Feil, MD 02/21/18 1511    Kinnie Feil, MD 02/21/18 1515    Kinnie Feil, MD 02/21/18 [phone]

## 2018-02-21 NOTE — Discharge Instructions (Signed)
It was nice seeing you. You have what we call urticaria with angioedema. I am glad your lip swelling is improving. I prefer ED observation for few hours and potentially get IM steroid injection to see if it will improve your symptoms. Please use Clarinex daily for 14 days.  Hold Benadryl for now. I also gave you EPiPen as needed for lip swelling, tongue swelling and SOB. If you are unable to go to the ED today, please do so if you have the following symptoms: Worsening lip swelling. Tongue swelling. Difficulty breathing. Chest tightness. Please see your PCP in 2 days for reassessment.

## 2018-02-21 NOTE — ED Triage Notes (Addendum)
Pt says he woke up this morning with lower lip swelling. Pt stated he got and itchy, generalized rash on Friday. Pt states he started taking Prednisone on Thursday afternoon.

## 2018-02-22 ENCOUNTER — Encounter (HOSPITAL_COMMUNITY): Payer: Self-pay | Admitting: Emergency Medicine

## 2018-02-22 ENCOUNTER — Emergency Department (HOSPITAL_COMMUNITY)
Admission: EM | Admit: 2018-02-22 | Discharge: 2018-02-22 | Disposition: A | Discharge status: Home / Self Care | Payer: 59 | Attending: Emergency Medicine | Admitting: Emergency Medicine

## 2018-02-22 DIAGNOSIS — Z7982 Long term (current) use of aspirin: Secondary | ICD-10-CM | POA: Insufficient documentation

## 2018-02-22 DIAGNOSIS — R600 Localized edema: Secondary | ICD-10-CM | POA: Insufficient documentation

## 2018-02-22 DIAGNOSIS — Z79899 Other long term (current) drug therapy: Secondary | ICD-10-CM | POA: Diagnosis not present

## 2018-02-22 DIAGNOSIS — Z87891 Personal history of nicotine dependence: Secondary | ICD-10-CM | POA: Diagnosis not present

## 2018-02-22 DIAGNOSIS — L509 Urticaria, unspecified: Secondary | ICD-10-CM | POA: Diagnosis not present

## 2018-02-22 DIAGNOSIS — R609 Edema, unspecified: Secondary | ICD-10-CM

## 2018-02-22 DIAGNOSIS — T7840XA Allergy, unspecified, initial encounter: Secondary | ICD-10-CM | POA: Diagnosis present

## 2018-02-22 LAB — CBC WITH DIFFERENTIAL/PLATELET
Abs Immature Granulocytes: 0 10*3/uL (ref 0.0–0.1)
Basophils Absolute: 0 10*3/uL (ref 0.0–0.1)
Basophils Relative: 0 %
Eosinophils Absolute: 0 10*3/uL (ref 0.0–0.7)
Eosinophils Relative: 0 %
HEMATOCRIT: 42.9 % (ref 39.0–52.0)
HEMOGLOBIN: 14.3 g/dL (ref 13.0–17.0)
IMMATURE GRANULOCYTES: 0 %
LYMPHS ABS: 2.1 10*3/uL (ref 0.7–4.0)
Lymphocytes Relative: 17 %
MCH: 31.6 pg (ref 26.0–34.0)
MCHC: 33.3 g/dL (ref 30.0–36.0)
MCV: 94.7 fL (ref 78.0–100.0)
MONOS PCT: 4 %
Monocytes Absolute: 0.5 10*3/uL (ref 0.1–1.0)
NEUTROS PCT: 79 %
Neutro Abs: 9.8 10*3/uL — ABNORMAL HIGH (ref 1.7–7.7)
Platelets: 296 10*3/uL (ref 150–400)
RBC: 4.53 MIL/uL (ref 4.22–5.81)
RDW: 13.1 % (ref 11.5–15.5)
WBC: 12.5 10*3/uL — AB (ref 4.0–10.5)

## 2018-02-22 LAB — COMPREHENSIVE METABOLIC PANEL
ALT: 25 U/L (ref 0–44)
AST: 19 U/L (ref 15–41)
Albumin: 3.4 g/dL — ABNORMAL LOW (ref 3.5–5.0)
Alkaline Phosphatase: 53 U/L (ref 38–126)
Anion gap: 10 (ref 5–15)
BUN: 21 mg/dL — ABNORMAL HIGH (ref 6–20)
CO2: 26 mmol/L (ref 22–32)
CREATININE: 1.27 mg/dL — AB (ref 0.61–1.24)
Calcium: 9 mg/dL (ref 8.9–10.3)
Chloride: 106 mmol/L (ref 98–111)
GFR, EST NON AFRICAN AMERICAN: 60 mL/min — AB (ref >60)
Glucose, Bld: 93 mg/dL (ref 70–99)
Potassium: 3.8 mmol/L (ref 3.5–5.1)
SODIUM: 142 mmol/L (ref 135–145)
Total Bilirubin: 0.9 mg/dL (ref 0.3–1.2)
Total Protein: 6.5 g/dL (ref 6.5–8.1)

## 2018-02-22 MED ORDER — FAMOTIDINE 20 MG PO TABS: 20.0000 mg | ORAL_TABLET | Freq: Two times a day (BID) | ORAL | 0 refills | Status: DC

## 2018-02-22 MED ORDER — PREDNISONE 10 MG (21) PO TBPK: ORAL_TABLET | ORAL | 0 refills | Status: DC

## 2018-02-22 MED ORDER — FAMOTIDINE IN NACL 20-0.9 MG/50ML-% IV SOLN
20.0000 mg | INTRAVENOUS | Status: AC
  Administered 2018-02-22: 20 mg via INTRAVENOUS
  Filled 2018-02-22: qty 50

## 2018-02-22 MED ORDER — DIPHENHYDRAMINE HCL 50 MG/ML IJ SOLN
50.0000 mg | Freq: Once | INTRAMUSCULAR | Status: AC
  Administered 2018-02-22: 50 mg via INTRAVENOUS

## 2018-02-22 MED ORDER — DIPHENHYDRAMINE HCL 50 MG/ML IJ SOLN
25.0000 mg | Freq: Once | INTRAMUSCULAR | Status: DC
  Filled 2018-02-22: qty 1

## 2018-02-22 MED ORDER — SODIUM CHLORIDE 0.9 % IV BOLUS
1000.0000 mL | Freq: Once | INTRAVENOUS | Status: AC
  Administered 2018-02-22: 1000 mL via INTRAVENOUS

## 2018-02-22 MED ORDER — METHYLPREDNISOLONE SODIUM SUCC 125 MG IJ SOLR
125.0000 mg | Freq: Once | INTRAMUSCULAR | Status: AC
  Administered 2018-02-22: 125 mg via INTRAVENOUS
  Filled 2018-02-22: qty 2

## 2018-02-22 NOTE — ED Triage Notes (Addendum)
Reports possible allergic reaction.  Has swelling to mouth and face. Reports this started after receiving prednisone for a rash on Thursday then seen at urgent care at 3pm Saturday.  Was told to come to ed for observation.  Denies having any sob or throat itching/swelling.

## 2018-02-22 NOTE — Discharge Instructions (Addendum)
Allergic Reaction Instructions:  Benadryl: Take 25 mg of Benadryl every 6 hours for the next 48 hours.  Use caution as Benadryl can make you drowsy. Pepcid: Take the Pepcid, as prescribed, over the next 3 days. Prednisone: Take prednisone, as prescribed, until finished.  Follow-up with your primary care provider on this matter.  Allergy testing with an allergist may be warranted.  Return to the ED for worsening symptoms, shortness of breath, chest pain, palpitations, persistent vomiting, throat swelling, worsening facial swelling, or any other major concerns.

## 2018-02-22 NOTE — Medical Student Note (Signed)
[facility] DEPT Provider Student Note For educational purposes for Medical, PA and NP students only and not part of the legal medical record.   CSN: [id number] Arrival date & time: 02/22/18  0255   History   Chief Complaint Chief Complaint  Patient presents with  . Allergic Reaction    HPI Elijah Robertson is a 62 y.o. male.  Patient presents with wheals, itching and facial swelling for 5 days. The lesions initially appeared on the thigh and groin area. He states that those lesions have since resolved but he has developed new wheals on his trunk, neck and head. The lesions are intensely itchy. He endorses swelling of the right side, upper lip and bilateral cheeks which he noticed when he woke up at 2 a.m. This morning. He states that there was swelling on his lower lip yesterday which resolved after Epi administration at urgent care yesterday. He denies any shortness of breath, swelling of his tongue, nose and throat. He has not been camping or hiking recently and denies any use of new chemicals at work. He works as on the Teaching laboratory technician at [employer].  He denies history of previous STIs other than "crabs a long, long time ago" and states that he has been with the same partner for the past several years and uses protection "on and off". He has no history of organ transplant or other immunosuppressed state.  The only thing he can think of which has changed was he began taking Turmeric. He has not changed lotion, soap or any other medications.      Past Medical History:  Diagnosis Date  . Diverticulosis   . GERD (gastroesophageal reflux disease)   . Hemorrhoids   . Perianal fistula   . Seizures ([place])    Childhood - none in adulthood  . Sleep apnea     Patient Active Problem List   Diagnosis Date Noted  . Rash 02/19/2018  . Routine health maintenance 10/28/2016  . Erectile dysfunction due to diseases classified elsewhere 09/11/2016  . Snoring 09/11/2016  . Polyp of colon 09/11/2016    . Elevated blood pressure reading 09/11/2016  . Obesity 09/11/2016    Past Surgical History:  Procedure Laterality Date  . COLONOSCOPY W/ POLYPECTOMY    . NOSE SURGERY     Septal surgery  . RECTAL SURGERY  05/2017   fistula repair       Home Medications    Prior to Admission medications   Medication Sig Start Date End Date Taking? Authorizing Provider  acetaminophen (TYLENOL) 325 MG tablet Take 650 mg by mouth every 6 (six) hours as needed.    [provider]  aspirin EC 81 MG tablet Take 81 mg by mouth daily.    [provider]  atorvastatin (LIPITOR) 20 MG tablet Take 1 tablet (20 mg total) by mouth daily. Patient not taking: Reported on 02/08/2018 11/06/17   Marrian Salvage, FNP  [facility] SALINE NASAL SPRAY NA Place into the nose.    [provider]  desloratadine (CLARINEX) 5 MG tablet Take 1 tablet (5 mg total) by mouth daily. 02/21/18   Kinnie Feil, MD  desonide (DESOWEN) 0.05 % ointment  12/11/16   [provider]  EPINEPHrine 0.3 mg/0.3 mL IJ SOAJ injection Inject 0.3 mLs (0.3 mg total) into the muscle once as needed (anaphylaxis/allergic reaction). 02/21/18   Kinnie Feil, MD  ketoconazole (NIZORAL) 2 % cream Apply 1 application topically daily. 09/11/16   Golden Circle, FNP  mupirocin ointment (BACTROBAN) 2 % Apply 1 application topically 2 (two) times daily. 02/08/18   Tasia Catchings, Amy V, PA-C  predniSONE (DELTASONE) 20 MG tablet Take 2 tablets (40 mg total) by mouth daily with breakfast. 02/19/18   Hoyt Koch, MD  saw palmetto 160 MG capsule Take 160 mg by mouth 5 (five) times daily as needed.    Emergency, Nurse, RN  sildenafil (REVATIO) 20 MG tablet Take 1-5 tablets by mouth daily as needed for erectile dysfunction. 10/30/17   Lance Sell, NP  Turmeric 500 MG CAPS Take by mouth daily.    Emergency, Nurse, RN    Family History Family History  Problem Relation Age of Onset  . Breast cancer Mother   . Healthy  Maternal Grandmother   . Healthy Maternal Grandfather   . Healthy Paternal Grandmother   . Colon cancer Neg Hx     Social History Social History   Tobacco Use  . Smoking status: Former Smoker    Packs/day: 1.00    Years: 32.00    Pack years: 32.00    Types: Cigarettes    Last attempt to quit: 08/05/2010    Years since quitting: 7.5  . Smokeless tobacco: Never Used  Substance Use Topics  . Alcohol use: Yes    Alcohol/week: 1.2 - 1.8 oz    Types: 2 - 3 Glasses of wine per week  . Drug use: Yes    Types: Marijuana    Comment: 1x every couple of months,FEB 2018     Allergies   Prednisone   Review of Systems Review of Systems  Constitutional: Negative for fever.  HENT: Positive for facial swelling. Negative for congestion and postnasal drip.   Skin: Positive for rash (Erythematous wheals across upper and lower back, underarms, back of neck and scalp).     Physical Exam Updated Vital Signs BP 116/63 (BP Location: Right Arm)   Pulse 91   Temp 98.3 F (36.8 C) (Oral)   Resp 16   Ht 5' 9.5" (1.765 m)   Wt 96.6 kg   SpO2 99%   BMI 31.00 kg/m   Physical Exam  Constitutional: He appears well-developed. No distress.  Eyes: Pupils are equal, round, and reactive to light.  Pulmonary/Chest: Effort normal and breath sounds normal. No stridor. No respiratory distress. He has no wheezes. He has no rales.  Abdominal: Soft. There is no tenderness. There is no rebound and no guarding.  Skin: Rash (Unable to assess groin and thigh lesions due to patient being in a hallway bed) noted. He is not diaphoretic.     Psychiatric: He has a normal mood and affect. His behavior is normal. Judgment and thought content normal.     ED Treatments / Results  Labs (all labs ordered are listed, but only abnormal results are displayed) Labs Reviewed - No data to display  EKG None Radiology No results found.  Procedures Procedures (including critical care time)  Medications Ordered  in ED Medications  diphenhydrAMINE (BENADRYL) injection 25 mg (has no administration in time range)  famotidine (PEPCID) IVPB 20 mg premix (has no administration in time range)     Initial Impression / Assessment and Plan / ED Course  I have reviewed the triage vital signs and the nursing notes.  Pertinent labs & imaging results that were available during my care of the patient were reviewed by me and considered in my medical decision making (see chart for details).  Clinical Course as of Mar 04 856  Nancy Fetter Feb 22, 2018  0815 Patient states he feels better. Swelling has subsided somewhat.  Itching has resolved.   [SJ]  0845 Patient shows no increased work of breathing.  Speaks in full sentences without difficulty.  No tachypnea on my exam.  Resp(!): 24 [SJ]  1045 I have reassessed patient through multiple instances of these episodes of recorded SPO2 <94%. When they occur, they are not accompanied by organized waveform.  SpO2: 93 % [SJ]    Clinical Course User Index [SJ] Joy, Shawn C, PA-C    Final Clinical Impressions(s) / ED Diagnoses   Final diagnoses:  None    New Prescriptions New Prescriptions   No medications on file

## 2018-02-22 NOTE — ED Provider Notes (Signed)
[facility] EMERGENCY DEPARTMENT Provider Note   CSN: [id number] Arrival date & time: 02/22/18  0255     History   Chief Complaint Chief Complaint  Patient presents with  . Allergic Reaction    HPI Elijah Robertson is a 62 y.o. male.  HPI   Elijah Robertson is a 62 y.o. male, with a history of GERD, presenting to the ED with hives beginning July 17.  States itching began on his hands bilaterally, followed by urticarial rash that spread throughout his body. Patient was seen by his PCP the same day symptoms began and prescribed 40 mg prednisone for 4 days.  He also stopped taking all of his medications and supplements. Patient began to have swelling to the lower lip yesterday, was seen at urgent care, and epinephrine was administered. Earlier this morning, patient began to have swelling to his upper lip and cheeks.  He notes a sore throat. He denies new medications, body products, clothing, environmental exposures, pets, or any other new items.   Denies fever/chills, N/V/D, abdominal pain, chest pain, shortness of breath, throat swelling, difficulty swallowing, or any other complaints.  Past Medical History:  Diagnosis Date  . Diverticulosis   . GERD (gastroesophageal reflux disease)   . Hemorrhoids   . Perianal fistula   . Seizures ([place])    Childhood - none in adulthood  . Sleep apnea     Patient Active Problem List   Diagnosis Date Noted  . Rash 02/19/2018  . Routine health maintenance 10/28/2016  . Erectile dysfunction due to diseases classified elsewhere 09/11/2016  . Snoring 09/11/2016  . Polyp of colon 09/11/2016  . Elevated blood pressure reading 09/11/2016  . Obesity 09/11/2016    Past Surgical History:  Procedure Laterality Date  . COLONOSCOPY W/ POLYPECTOMY    . NOSE SURGERY     Septal surgery  . RECTAL SURGERY  05/2017   fistula repair        Home Medications    Prior to Admission medications   Medication Sig Start Date End  Date Taking? Authorizing Provider  acetaminophen (TYLENOL) 650 MG CR tablet Take 1,950 mg by mouth every 8 (eight) hours as needed for pain.   Yes [provider]  aspirin EC 81 MG tablet Take 81 mg by mouth daily.   Yes [provider]  [facility] SALINE NASAL SPRAY NA Place 1 spray into the nose 2 (two) times daily.    Yes [provider]  desloratadine (CLARINEX) 5 MG tablet Take 1 tablet (5 mg total) by mouth daily. 02/21/18  Yes Andrena Mews T, MD  EPINEPHrine 0.3 mg/0.3 mL IJ SOAJ injection Inject 0.3 mLs (0.3 mg total) into the muscle once as needed (anaphylaxis/allergic reaction). 02/21/18  Yes Kinnie Feil, MD  ketoconazole (NIZORAL) 2 % cream Apply 1 application topically daily. 09/11/16  Yes Golden Circle, FNP  saw palmetto 160 MG capsule Take 800 mg by mouth daily.    Yes Emergency, Nurse, RN  sildenafil (REVATIO) 20 MG tablet Take 1-5 tablets by mouth daily as needed for erectile dysfunction. 10/30/17  Yes Lance Sell, NP  atorvastatin (LIPITOR) 20 MG tablet Take 1 tablet (20 mg total) by mouth daily. Patient not taking: Reported on 02/08/2018 11/06/17   Marrian Salvage, FNP  famotidine (PEPCID) 20 MG tablet Take 1 tablet (20 mg total) by mouth 2 (two) times daily for 3 days. 02/22/18 02/25/18  Angelik Walls C, PA-C  predniSONE (STERAPRED UNI-PAK 21 TAB) 10 MG (  21) TBPK tablet Take 6 tabs by mouth daily  for 2 days, then 5 tabs for 2 days, then 4 tabs for 2 days, then 3 tabs for 2 days, 2 tabs for 2 days, then 1 tab by mouth daily for 2 days 02/22/18   Yuchen Fedor C, PA-C  Turmeric 500 MG CAPS Take 500 mg by mouth daily.     Emergency, Nurse, RN    Family History Family History  Problem Relation Age of Onset  . Breast cancer Mother   . Healthy Maternal Grandmother   . Healthy Maternal Grandfather   . Healthy Paternal Grandmother   . Colon cancer Neg Hx     Social History Social History   Tobacco Use  . Smoking status: Former Smoker     Packs/day: 1.00    Years: 32.00    Pack years: 32.00    Types: Cigarettes    Last attempt to quit: 08/05/2010    Years since quitting: 7.5  . Smokeless tobacco: Never Used  Substance Use Topics  . Alcohol use: Yes    Alcohol/week: 1.2 - 1.8 oz    Types: 2 - 3 Glasses of wine per week  . Drug use: Yes    Types: Marijuana    Comment: 1x every couple of months,FEB 2018     Allergies   Prednisone and Turmeric   Review of Systems Review of Systems  Constitutional: Negative for chills and fever.  HENT: Positive for facial swelling. Negative for trouble swallowing and voice change.   Respiratory: Negative for cough and shortness of breath.   Cardiovascular: Negative for chest pain.  Gastrointestinal: Negative for abdominal pain, diarrhea, nausea and vomiting.  Musculoskeletal: Negative for back pain and neck pain.  Skin: Positive for rash.  Neurological: Negative for dizziness, weakness, light-headedness, numbness and headaches.  All other systems reviewed and are negative.    Physical Exam Updated Vital Signs BP 127/74   Pulse 96   Temp 98.3 F (36.8 C) (Oral)   Resp (!) 24   Ht 5' 9.5" (1.765 m)   Wt 96.6 kg (213 lb)   SpO2 92%   BMI 31.00 kg/m   Physical Exam  Constitutional: He appears well-developed and well-nourished. No distress.  HENT:  Head: Normocephalic and atraumatic.  Mouth/Throat: Oropharynx is clear and moist.  Some mild swelling noted to the lips and cheeks. No trismus.  Mouth opening to at least 3 finger widths.  Handles oral secretions without difficulty.  No swelling or tenderness to the submental or submandibular regions.  No swelling or tenderness into the soft tissues of the neck. No noted hoarseness.  Eyes: Conjunctivae are normal.  Neck: Normal range of motion. Neck supple.  Cardiovascular: Normal rate, regular rhythm, normal heart sounds and intact distal pulses.  Pulmonary/Chest: Effort normal and breath sounds normal. No respiratory  distress.  No increased work of breathing.  Speaks in full sentences without difficulty.  Abdominal: Soft. There is no tenderness. There is no guarding.  Musculoskeletal: He exhibits no edema.  No peripheral edema.  Lymphadenopathy:    He has no cervical adenopathy.  Neurological: He is alert.  Skin: Skin is warm and dry. Rash noted. He is not diaphoretic.  Scattered urticaria to the trunk and extremities.  No pustules or vesicles noted.  Psychiatric: He has a normal mood and affect. His behavior is normal.  Nursing note and vitals reviewed.    ED Treatments / Results  Labs (all labs ordered are listed, but only abnormal  results are displayed) Labs Reviewed  COMPREHENSIVE METABOLIC PANEL - Abnormal; Notable for the following components:      Result Value   BUN 21 (*)    Creatinine, Ser 1.27 (*)    Albumin 3.4 (*)    GFR calc non Af Amer 60 (*)    All other components within normal limits  CBC WITH DIFFERENTIAL/PLATELET - Abnormal; Notable for the following components:   WBC 12.5 (*)    Neutro Abs 9.8 (*)    All other components within normal limits    EKG None  Radiology No results found.  Procedures Procedures (including critical care time)  Medications Ordered in ED Medications  famotidine (PEPCID) IVPB 20 mg premix (0 mg Intravenous Stopped 02/22/18 0814)  diphenhydrAMINE (BENADRYL) injection 50 mg (50 mg Intravenous Given 02/22/18 0731)  sodium chloride 0.9 % bolus 1,000 mL (0 mLs Intravenous Stopped 02/22/18 0845)  methylPREDNISolone sodium succinate (SOLU-MEDROL) 125 mg/2 mL injection 125 mg (125 mg Intravenous Given 02/22/18 0740)     Initial Impression / Assessment and Plan / ED Course  I have reviewed the triage vital signs and the nursing notes.  Pertinent labs & imaging results that were available during my care of the patient were reviewed by me and considered in my medical decision making (see chart for details).  Clinical Course as of Feb 22 1134  Sun  Feb 22, 2018  0815 Patient states he feels better. Swelling has subsided somewhat.  Itching has resolved.   [SJ]  0845 Patient shows no increased work of breathing.  Speaks in full sentences without difficulty.  No tachypnea on my exam.  Resp(!): 24 [SJ]  1045 I have reassessed patient through multiple instances of these episodes of recorded SPO2 <94%. When they occur, they are not accompanied by organized waveform.  SpO2: 93 % [SJ]    Clinical Course User Index [SJ] Wynonna Fitzhenry C, PA-C    Patient presents with some swelling to the face as well as hives. He does not present as anaphylaxis or true angioedema here in the ED. Patient is nontoxic appearing, afebrile, not tachycardic, not tachypneic on my exams, not hypotensive, and is in no apparent distress.   Patient was prescribed an EpiPen by the urgent care center.  He has not had to use it. Patient did not have progression of his symptoms while he was taking the prednisone.  His symptoms began before the prednisone started, therefore I do not think patient's symptoms are due to the prednisone. Since he states he has stopped taking all of his medications and supplements, but is still having additional symptoms, I suspect patient is still being exposed to whatever is causing this reaction.    Findings and plan of care discussed with April Palumbo, MD.   Vitals:   02/22/18 0930 02/22/18 1030 02/22/18 1100 02/22/18 1111  BP: 132/81 136/87 128/85   Pulse: 99 93 87   Resp: (!) 21 (!) 23 18   Temp:      TempSrc:      SpO2: 92% 93% 93% 95%  Weight:      Height:          Final Clinical Impressions(s) / ED Diagnoses   Final diagnoses:  Hives  Swelling    ED Discharge Orders        Ordered    famotidine (PEPCID) 20 MG tablet  2 times daily     02/22/18 1017    predniSONE (STERAPRED UNI-PAK 21 TAB) 10 MG (21) TBPK  tablet     02/22/18 1017       Layla Maw 02/22/18 1135    Palumbo, April, MD 02/25/18 [phone]

## 2018-02-22 NOTE — ED Notes (Signed)
Claiborne Billings PA has assess patient

## 2018-02-22 NOTE — ED Notes (Signed)
Patient verbalizes understanding of discharge instructions. Opportunity for questioning and answers were provided. Armband removed by staff, pt discharged from ED ambulatory.   

## 2018-02-23 ENCOUNTER — Telehealth: Payer: Self-pay | Admitting: Family

## 2018-02-23 ENCOUNTER — Encounter: Payer: Self-pay | Admitting: Nurse Practitioner

## 2018-02-23 DIAGNOSIS — L509 Urticaria, unspecified: Secondary | ICD-10-CM

## 2018-02-23 NOTE — Telephone Encounter (Signed)
Please let him know that received notice from ER about visit; would recommend that he see an allergist in follow-up; will put that referral in place for him. Please verify that he does have Epi-pen.

## 2018-02-23 NOTE — Telephone Encounter (Signed)
LVM for pt to call back in regards.  

## 2018-02-23 NOTE — Telephone Encounter (Signed)
Copied from [place] [id number]. Topic: Quick Communication - Office Called Patient >> Feb 23, 2018  3:01 PM Lorrin Jackson, CMA wrote: Reason for CRM: Called pt to let him know a referral was placed to an allergist due to recent ED visit and want to verify that he has an epi pen on hand.  >> Feb 23, 2018  3:23 PM Carolyn Stare wrote:  Pt called back and is a aware a referral was placed for allergy and he verified he does have an Epi  pen

## 2018-02-24 NOTE — Telephone Encounter (Signed)
Pt is aware of referral and does have an epi pen.

## 2018-02-26 ENCOUNTER — Encounter: Payer: Self-pay | Admitting: Nurse Practitioner

## 2018-02-26 ENCOUNTER — Ambulatory Visit (INDEPENDENT_AMBULATORY_CARE_PROVIDER_SITE_OTHER): Payer: 59 | Admitting: Nurse Practitioner

## 2018-02-26 VITALS — BP 124/82 | HR 69 | Temp 97.9°F | Ht 69.5 in | Wt 214.0 lb

## 2018-02-26 DIAGNOSIS — R21 Rash and other nonspecific skin eruption: Secondary | ICD-10-CM

## 2018-02-26 NOTE — Patient Instructions (Addendum)
You can take benadryl for itching.   You can also take zyrtec to help reduce itching 1 pill twice a day.   Please keep your upcoming appointment with allergist.   Anaphylactic Reaction An anaphylactic reaction (anaphylaxis) is a sudden allergic reaction that is very bad (severe). It also affects more than one part of the body. This condition can be life-threatening. If you have an anaphylactic reaction, you need to get medical help right away. You may need to stay in the hospital. Your doctor may teach you how to use an allergy kit (anaphylaxis kit) and how to give yourself an allergy shot (epinephrine injection). You can give yourself an allergy shot with what is commonly called an auto-injector "pen." Symptoms of an anaphylactic reaction may include:  A stuffy nose (nasal congestion).  Headache.  Tingling in your mouth.  A flushed face.  An itchy, red rash.  Swelling of your eyes, lips, face, or tongue.  Swelling of the back of your mouth and your throat.  Breathing loudly (wheezing).  A hoarse voice.  Itchy, red, swollen areas of skin (hives).  Dizziness or light-headedness.  Passing out (fainting).  Feeling worried or nervous (anxiety).  Feeling confused.  Pain in your belly (abdomen) or chest.  Trouble with breathing, talking, or swallowing.  A tight feeling in your chest or throat.  Fast or uneven heartbeats (palpitations).  Throwing up (vomiting).  Watery poop (diarrhea).  Follow these instructions at home: Safety  Always keep an auto-injector pen or your allergy kit with you. These could save your life. Use them as told by your doctor.  Do not drive until your doctor says that it is safe.  Make sure that you, the people who live with you, and your employer know: ? How to use your allergy kit. ? How to use an auto-injector pen to give you an allergy shot.  If you used your auto-injector pen: ? Get more medicine for it right away. This is  important in case you have another reaction. ? Get help right away.  Wear a bracelet or necklace that says you have an allergy, if your doctor tells you to do this.  Learn the signs of a very bad allergic reaction.  Work with your doctors to make a plan for what to do if you have a very bad allergic reaction. Being prepared is important. General instructions  Take over-the-counter and prescription medicines only as told by your doctor.  If you have itchy, red, swollen areas of skin or a rash: ? Use over-the-counter medicine (antihistamine) as told by your doctor. ? Put cold, wet cloths (cold compresses) on your skin. ? Take baths or showers in cool water. Avoid hot water.  If you had tests done, it is up to you to get your test results. Ask your doctor when your results will be ready.  Tell any doctors who care for you that you have an allergy.  Keep all follow-up visits as told by your doctor. This is important. How is this prevented?  Avoid things (allergens) that gave you a very bad allergic reaction before.  If you have a food allergy and you go to a restaurant, tell your server about your allergy. If you are not sure if your meal was made with food that you are allergic to, ask your server before you eat it. Contact a doctor if:  You have symptoms of an allergic reaction. You may notice them soon after being around whatever it is that  you are allergic to. Symptoms may include: ? A rash. ? A headache. ? Sneezing or a runny nose. ? Swelling. ? Feeling sick to your stomach. ? Watery poop. Get help right away if:  You had to use your auto-injector pen. You must go to the emergency room even if the medicine seems to be working.  You have any of these: ? A tight feeling in your chest or your throat. ? Loud breathing. ? Trouble with breathing. ? Itchy, red, swollen areas of skin. ? Red skin or itching all over your body. ? Swelling in your lips, tongue, or the back of your  throat.  You have throwing up that gets very bad.  You have watery poop that gets very bad.  You pass out or feel like you might pass out. These symptoms may be an emergency. Do not wait to see if the symptoms will go away. Use your auto-injector pen or allergy kit as you have been told. Get medical help right away. Call your local emergency services (911 in the [place] Do not drive yourself to the hospital. Summary  An anaphylactic reaction (anaphylaxis) is a sudden allergic reaction that is very bad (severe).  This condition can be life-threatening. If you have an anaphylactic reaction, you need to get medical help right away.  Your doctor may teach you how to use an allergy kit (anaphylaxis kit) and how to give yourself an allergy shot (epinephrine injection) with an auto-injector "pen."  Always keep an auto-injector pen or your allergy kit with you. These could save your life. Use them as told by your doctor.  If you had to use your auto-injector pen, you must go to the emergency room even if the medicine seems to be working. This information is not intended to replace advice given to you by your health care provider. Make sure you discuss any questions you have with your health care provider. Document Released: 01/08/2008 Document Revised: 03/15/2016 Document Reviewed: 03/15/2016 Elsevier Interactive Patient Education  2017 [employer].

## 2018-02-26 NOTE — Progress Notes (Signed)
Name: Elijah Robertson   MRN: [id number]    DOB: 1955-12-29   Date:02/26/2018       Progress Note  Subjective  Chief Complaint Allergic reaction  HPI Elijah Robertson is here today for an ER follow up, seen in the ED on 7/21 for hives and itching, which he first began last Wednesday. He says he woke last Wednesday 7/17 with hives to his abdomen, which spread across his hips and down his legs by Wednesday evening. He was then seen here on Thursday 7/18 by another provider for the rash and started on prednisone course but the rash continued to worsen and spread to upper body and then he woke on 7/20 with swelling to lower lip so he decided to go to [facility]. At [facility] he was given an EPI shot and referred to ER, but went home instead. When he woke the next day, on 7/21, both of his lips and entire face were swollen and at that point he decided to go to the ER where he was given IV benadryl, pepcid, prednisone and sent home with medrol dose pack. He says today his symptoms have mostly resolved, no further facial or lip swelling and rash has decreased except for a few hives to his torso and generalized itching. He denies any syncope, weakness, oral swelling, difficulty swallowing or breathing, chest pain, shortness of breath, nausea, or vomiting. He was given EPI pen by ER. He noticed the allergic reaction seemed to start after taking a new turmeric supplement, which he has stopped taking. He has not experienced a similar allergic reaction in the past but has noticed increased allergies since moving to [place] in his 51s. He has an appointment scheduled with allergist upcoming on 8/5. He has been trying sudafed at home for itching with no improvement  Patient Active Problem List   Diagnosis Date Noted  . Rash 02/19/2018  . Routine health maintenance 10/28/2016  . Erectile dysfunction due to diseases classified elsewhere 09/11/2016  . Snoring 09/11/2016  . Polyp of colon 09/11/2016  . Elevated blood pressure reading  09/11/2016  . Obesity 09/11/2016    Past Surgical History:  Procedure Laterality Date  . COLONOSCOPY W/ POLYPECTOMY    . NOSE SURGERY     Septal surgery  . RECTAL SURGERY  05/2017   fistula repair    Family History  Problem Relation Age of Onset  . Breast cancer Mother   . Healthy Maternal Grandmother   . Healthy Maternal Grandfather   . Healthy Paternal Grandmother   . Colon cancer Neg Hx     Social History   Socioeconomic History  . Marital status: Single    Spouse name: Not on file  . Number of children: 3  . Years of education: 81  . Highest education level: Not on file  Occupational History  . Not on file  Social Needs  . Financial resource strain: Not on file  . Food insecurity:    Worry: Not on file    Inability: Not on file  . Transportation needs:    Medical: Not on file    Non-medical: Not on file  Tobacco Use  . Smoking status: Former Smoker    Packs/day: 1.00    Years: 32.00    Pack years: 32.00    Types: Cigarettes    Last attempt to quit: 08/05/2010    Years since quitting: 7.5  . Smokeless tobacco: Never Used  Substance and Sexual Activity  . Alcohol use: Yes  Alcohol/week: 1.2 - 1.8 oz    Types: 2 - 3 Glasses of wine per week  . Drug use: Yes    Types: Marijuana    Comment: 1x every couple of months,FEB 2018  . Sexual activity: Yes  Lifestyle  . Physical activity:    Days per week: Not on file    Minutes per session: Not on file  . Stress: Not on file  Relationships  . Social connections:    Talks on phone: Not on file    Gets together: Not on file    Attends religious service: Not on file    Active member of club or organization: Not on file    Attends meetings of clubs or organizations: Not on file    Relationship status: Not on file  . Intimate partner violence:    Fear of current or ex partner: Not on file    Emotionally abused: Not on file    Physically abused: Not on file    Forced sexual activity: Not on file  Other  Topics Concern  . Not on file  Social History Narrative   Fun/Hobby: Engineer, civil (consulting) sports, hang out with friends; Doctor, general practice; Travel     Current Outpatient Medications:  .  acetaminophen (TYLENOL) 650 MG CR tablet, Take 1,950 mg by mouth every 8 (eight) hours as needed for pain., Disp: , Rfl:  .  aspirin EC 81 MG tablet, Take 81 mg by mouth daily., Disp: , Rfl:  .  atorvastatin (LIPITOR) 20 MG tablet, Take 1 tablet (20 mg total) by mouth daily., Disp: 30 tablet, Rfl: 1 .  [employer] SALINE NASAL SPRAY NA, Place 1 spray into the nose 2 (two) times daily. , Disp: , Rfl:  .  desloratadine (CLARINEX) 5 MG tablet, Take 1 tablet (5 mg total) by mouth daily., Disp: 14 tablet, Rfl: 0 .  EPINEPHrine 0.3 mg/0.3 mL IJ SOAJ injection, Inject 0.3 mLs (0.3 mg total) into the muscle once as needed (anaphylaxis/allergic reaction)., Disp: 1 Device, Rfl: 0 .  ketoconazole (NIZORAL) 2 % cream, Apply 1 application topically daily., Disp: 15 g, Rfl: 2 .  predniSONE (STERAPRED UNI-PAK 21 TAB) 10 MG (21) TBPK tablet, Take 6 tabs by mouth daily  for 2 days, then 5 tabs for 2 days, then 4 tabs for 2 days, then 3 tabs for 2 days, 2 tabs for 2 days, then 1 tab by mouth daily for 2 days, Disp: 42 tablet, Rfl: 0 .  saw palmetto 160 MG capsule, Take 800 mg by mouth daily. , Disp: , Rfl:  .  sildenafil (REVATIO) 20 MG tablet, Take 1-5 tablets by mouth daily as needed for erectile dysfunction., Disp: 50 tablet, Rfl: 1 .  Turmeric 500 MG CAPS, Take 500 mg by mouth daily. , Disp: , Rfl:  .  famotidine (PEPCID) 20 MG tablet, Take 1 tablet (20 mg total) by mouth 2 (two) times daily for 3 days., Disp: 6 tablet, Rfl: 0  Allergies  Allergen Reactions  . Prednisone     pt believes he is allergic to prednisone with uncomfortable feeling  . Turmeric     pt believes he is allergic to turmeric with uncomfortable feeling     ROS See HPI  Objective  Vitals:   02/26/18 1303  BP: 124/82  Pulse: 69  Temp: 97.9 F (36.6 C)  TempSrc:  Oral  SpO2: 96%  Weight: 214 lb (97.1 kg)  Height: 5' 9.5" (1.765 m)    Body mass index is 31.15  kg/m.  Physical Exam Vital signs reviewed. Constitutional: Patient appears well-developed and well-nourished. No distress.  HENT: Head: Normocephalic and atraumatic. Nose: Nose normal. Mouth/Throat: Oropharynx is clear and moist. No oropharyngeal exudate or edema.  Eyes: Conjunctivae and EOM are normal. Pupils are equal, round, and reactive to light. No scleral icterus.  Neck: Normal range of motion. Neck supple. Trachea midline. Cardiovascular: Normal rate, regular rhythm and normal heart sounds.  Distal pulses intact. Pulmonary/Chest: Effort normal and breath sounds normal. No respiratory distress. Neurological: He is alert and oriented to person, place, and time. Coordination, balance, strength, speech and gait are normal.  Skin: Skin is warm and dry. Erythematous, macular rash to lower back/hips. Psychiatric: Patient has a normal mood and affect. behavior is normal. Judgment and thought content normal.   Assessment & Plan  1. Rash Improving Continue prednisone course until complete, benadryl and zyrtec OTC for itching and allergy symptoms Continue scheduled follow up with allergist, or follow up sooner for new or worsening symptoms Return precautions including when to seek emergency care discussed and printed in AVS

## 2018-03-05 ENCOUNTER — Other Ambulatory Visit: Payer: Self-pay | Admitting: Family Medicine

## 2018-03-08 ENCOUNTER — Encounter (HOSPITAL_COMMUNITY): Payer: Self-pay

## 2018-03-08 ENCOUNTER — Ambulatory Visit (HOSPITAL_COMMUNITY)
Admission: EM | Admit: 2018-03-08 | Discharge: 2018-03-08 | Disposition: A | Discharge status: Home / Self Care | Payer: 59 | Attending: Family Medicine | Admitting: Family Medicine

## 2018-03-08 DIAGNOSIS — L509 Urticaria, unspecified: Secondary | ICD-10-CM | POA: Diagnosis not present

## 2018-03-08 DIAGNOSIS — T1490XD Injury, unspecified, subsequent encounter: Secondary | ICD-10-CM

## 2018-03-08 NOTE — ED Provider Notes (Signed)
[place]   [id number] 03/08/18 Arrival Time: 2952  SUBJECTIVE:  Elijah Robertson is a 62 y.o. male who presents with improved skin rash that began on 02/18/18.  Started taking tumeric supplement around the time of his symptoms.  Was seen at [facility] and ER and treated with prednisone taper.  Finished prednisone taper three days ago. Localizes the rash to upper thighs.  Describes it as red, and denies any associated pain or itchy.  Has appointment with allergist tomorrow.  Denies aggravating factors, speculates eating makes symptoms worse. Has not tried benadryl due to allergist appointment tomorrow.  Denies fever, chills, nausea, vomiting, swollen glands, SOB, chest pain, abdominal pain, changes in bowel or bladder function.    Incidentally, patient mentions healing LLE wound.  Initial injury 1 month ago after falling through vent in floor.  Was seen here and later by PCP.  Has been using bactroban with relief.   Denies warmth, pain, increasing redness or purulent discharge.  ROS: As per HPI.  Past Medical History:  Diagnosis Date  . Diverticulosis   . GERD (gastroesophageal reflux disease)   . Hemorrhoids   . Perianal fistula   . Seizures ([place])    Childhood - none in adulthood  . Sleep apnea    Past Surgical History:  Procedure Laterality Date  . COLONOSCOPY W/ POLYPECTOMY    . NOSE SURGERY     Septal surgery  . RECTAL SURGERY  05/2017   fistula repair   Allergies  Allergen Reactions  . Turmeric     pt believes he is allergic to turmeric with uncomfortable feeling   No current facility-administered medications on file prior to encounter.    Current Outpatient Medications on File Prior to Encounter  Medication Sig Dispense Refill  . acetaminophen (TYLENOL) 650 MG CR tablet Take 1,950 mg by mouth every 8 (eight) hours as needed for pain.    Marland Kitchen aspirin EC 81 MG tablet Take 81 mg by mouth daily.    Marland Kitchen atorvastatin (LIPITOR) 20 MG tablet Take 1 tablet (20 mg total) by mouth  daily. 30 tablet 1  . [employer] SALINE NASAL SPRAY NA Place 1 spray into the nose 2 (two) times daily.     Marland Kitchen desloratadine (CLARINEX) 5 MG tablet Take 1 tablet (5 mg total) by mouth daily. 14 tablet 0  . EPINEPHrine 0.3 mg/0.3 mL IJ SOAJ injection Inject 0.3 mLs (0.3 mg total) into the muscle once as needed (anaphylaxis/allergic reaction). 1 Device 0  . famotidine (PEPCID) 20 MG tablet Take 1 tablet (20 mg total) by mouth 2 (two) times daily for 3 days. 6 tablet 0  . ketoconazole (NIZORAL) 2 % cream Apply 1 application topically daily. 15 g 2  . predniSONE (STERAPRED UNI-PAK 21 TAB) 10 MG (21) TBPK tablet Take 6 tabs by mouth daily  for 2 days, then 5 tabs for 2 days, then 4 tabs for 2 days, then 3 tabs for 2 days, 2 tabs for 2 days, then 1 tab by mouth daily for 2 days 42 tablet 0  . saw palmetto 160 MG capsule Take 800 mg by mouth daily.     . sildenafil (REVATIO) 20 MG tablet Take 1-5 tablets by mouth daily as needed for erectile dysfunction. 50 tablet 1  . Turmeric 500 MG CAPS Take 500 mg by mouth daily.      Social History   Socioeconomic History  . Marital status: Single    Spouse name: Not on file  . Number of children:  3  . Years of education: 20  . Highest education level: Not on file  Occupational History  . Not on file  Social Needs  . Financial resource strain: Not on file  . Food insecurity:    Worry: Not on file    Inability: Not on file  . Transportation needs:    Medical: Not on file    Non-medical: Not on file  Tobacco Use  . Smoking status: Former Smoker    Packs/day: 1.00    Years: 32.00    Pack years: 32.00    Types: Cigarettes    Last attempt to quit: 08/05/2010    Years since quitting: 7.5  . Smokeless tobacco: Never Used  Substance and Sexual Activity  . Alcohol use: Yes    Alcohol/week: 1.2 - 1.8 oz    Types: 2 - 3 Glasses of wine per week  . Drug use: Yes    Types: Marijuana    Comment: 1x every couple of months,FEB 2018  . Sexual activity: Yes    Lifestyle  . Physical activity:    Days per week: Not on file    Minutes per session: Not on file  . Stress: Not on file  Relationships  . Social connections:    Talks on phone: Not on file    Gets together: Not on file    Attends religious service: Not on file    Active member of club or organization: Not on file    Attends meetings of clubs or organizations: Not on file    Relationship status: Not on file  . Intimate partner violence:    Fear of current or ex partner: Not on file    Emotionally abused: Not on file    Physically abused: Not on file    Forced sexual activity: Not on file  Other Topics Concern  . Not on file  Social History Narrative   Fun/Hobby: Engineer, civil (consulting) sports, hang out with friends; Doctor, general practice; Travel   Family History  Problem Relation Age of Onset  . Breast cancer Mother   . Healthy Maternal Grandmother   . Healthy Maternal Grandfather   . Healthy Paternal Grandmother   . Colon cancer Neg Hx     OBJECTIVE: Vitals:   03/08/18 1438  BP: 136/81  Pulse: (!) 116  Resp: 18  Temp: 97.8 F (36.6 C)  SpO2: 98%    General appearance: alert; no distress; speaking in full sentences Lungs: clear to auscultation bilaterally Heart: regular rate and rhythm.  Radial pulse 2+ bilaterally 98BPM on exam Extremities: no edema Skin: warm and dry; erythematous urticarial rash localized to bilateral upper thighs; blanches with pressure; nontender to palpation; incidentally 3 cm healing wound with eschar and mild surrounding erythema to lateral aspect of LLE, non-tender to palpation, no obvious purulent drainage Psychological: alert and cooperative; normal mood and affect  ASSESSMENT & PLAN:  1. Urticarial rash   2. Healing wound     No orders of the defined types were placed in this encounter.  Appointment with allergist tomorrow We will forego using a steroid at this time.  This may affect the results of your test tomorrow Avoid hot showers.  Use luke  warm water.   Return or go to the ER  Follow up with PCP or wound care regarding leg wound.   Clean daily with warm water and mild soap.   Return or go to the ER if you experience any new or worsening symptoms such as increased redness,  swelling, purulent drainage, fever, chills, nausea, vomiting, etc...   Reviewed expectations re: course of current medical issues. Questions answered. Outlined signs and symptoms indicating need for more acute intervention. Patient verbalized understanding. After Visit Summary given.   Lestine Box, PA-C 03/08/18 1611

## 2018-03-08 NOTE — ED Triage Notes (Signed)
Pt presents with continued rash after round of prednisone. Unknown to what rash is from. Hives presents on thighs.

## 2018-03-08 NOTE — Discharge Instructions (Addendum)
Appointment with allergist tomorrow We will forego using a steroid at this time.  This may affect the results of your test tomorrow Avoid hot showers.  Use luke warm water.   Return or go to the ER  Follow up with PCP or wound care regarding leg wound.   Clean daily with warm water and mild soap.   Return or go to the ER if you experience any new or worsening symptoms such as increased redness, swelling, purulent drainage, fever, chills, nausea, vomiting, etc..Marland Kitchen

## 2018-03-13 ENCOUNTER — Ambulatory Visit (INDEPENDENT_AMBULATORY_CARE_PROVIDER_SITE_OTHER): Payer: 59 | Admitting: Neurology

## 2018-03-13 DIAGNOSIS — G4733 Obstructive sleep apnea (adult) (pediatric): Secondary | ICD-10-CM

## 2018-03-13 DIAGNOSIS — G4734 Idiopathic sleep related nonobstructive alveolar hypoventilation: Secondary | ICD-10-CM

## 2018-03-13 DIAGNOSIS — Z9989 Dependence on other enabling machines and devices: Secondary | ICD-10-CM

## 2018-03-17 ENCOUNTER — Telehealth: Payer: Self-pay

## 2018-03-17 NOTE — Addendum Note (Signed)
Addended by: Star Age on: 03/17/2018 08:33 AM   Modules accepted: Orders

## 2018-03-17 NOTE — Progress Notes (Signed)
Patient had a CPAP titration study on 03/13/18, for optimization of treatment.   Please call and inform patient that I would like to change him from AutoPAP to CPAP of 11 cm. We can request a change through is DME and he can FU as scheduled with CM. If needed, we can review a download in a month or 2, he can call or email for that in the interim.  Star Age, MD, PhD [facility])

## 2018-03-17 NOTE — Telephone Encounter (Signed)
I called pt to discuss his sleep study results. No answer, left a message asking him to call me back. 

## 2018-03-17 NOTE — Telephone Encounter (Signed)
-----   Message from Star Age, MD sent at 03/17/2018  8:33 AM EDT ----- Patient had a CPAP titration study on 03/13/18, for optimization of treatment.   Please call and inform patient that I would like to change him from AutoPAP to CPAP of 11 cm. We can request a change through is DME and he can FU as scheduled with CM. If needed, we can review a download in a month or 2, he can call or email for that in the interim.  Star Age, MD, PhD [facility])

## 2018-03-17 NOTE — Telephone Encounter (Signed)
Pt returned my call. I advised him of his sleep study results and of Dr. Guadelupe Sabin recommendations. Pt will keep his appt with Hoyle Sauer, NP in December and will call us in the meantime for a download. Pt verbalized understanding of results. Pt had no questions at this time but was encouraged to call back if questions arise.

## 2018-03-17 NOTE — Procedures (Signed)
S PATIENT'S NAME:  Elijah Robertson, Elijah Robertson DOB:      01-23-1956      MR#:    [id number]     DATE OF RECORDING: 03/13/2018 REFERRING M.D.:  Caesar Chestnut, NP Study Performed:   CPAP  Titration HISTORY: 62 year old eman with a history of childhood seizures, status post septoplasty, reflux disease and obesity, who presents for full night PAP titration study to help optimize his treatment settings and O2 saturations. His home sleep test on 09/29/2017 indicated severe sleep apnea based on an AHI of 50.3 per hour, average oxygen saturation of 92%, nadir of 75% with significant time below or at 88% saturation of 38 minutes. He recently had an abnormal overnight pulse oximetry test while on AutoPAP therapy at home. The patient's weight 215 pounds with a height of 70 (inches), resulting in a BMI of 31.3 kg/m2. The patient's neck circumference measured 16.5 inches.  CURRENT MEDICATIONS: Tylenol, Lipitor, Aspirin, Saline nasal spray, Desowen, Nizoral, Revatio  PROCEDURE:  This is a multichannel digital polysomnogram utilizing the SomnoStar 11.2 system.  Electrodes and sensors were applied and monitored per AASM Specifications.   EEG, EOG, Chin and Limb EMG, were sampled at 200 Hz.  ECG, Snore and Nasal Pressure, Thermal Airflow, Respiratory Effort, CPAP Flow and Pressure, Oximetry was sampled at 50 Hz. Digital video and audio were recorded.      The patient was fitted with an Airfit N30i mask and also wore his bite guard for the night. CPAP was initiated at 5 cmH20 with heated humidity per AASM standards and pressure was advanced to 10 cmH20 because of hypopneas, apneas and desaturations.  At a PAP pressure of 10 cmH20, there was a reduction of the AHI to 8/hour with supine REM sleep achieved and O2 nadir of 92%.   Lights Out was at 22:00 and Lights On at 05:15. Total recording time (TRT) was 435.5 minutes, with a total sleep time (TST) of 388.5 minutes. The patient's sleep latency was 13 minutes. REM latency was 78.5  minutes, which is normal. The sleep efficiency was 89.2 %.    SLEEP ARCHITECTURE: WASO (Wake after sleep onset) was 33.5 minutes with mild sleep fragmentation noted. There were 17 minutes in Stage N1, 226.5 minutes Stage N2, 50 minutes Stage N3 and 95 minutes in Stage REM.  The percentage of Stage N1 was 4.4%, Stage N2 was 58.3%, which is mildly increased, Stage N3 was 12.9% and Stage R (REM sleep) was 24.5%, which is normal. The arousals were noted as: 91 were spontaneous, 0 were associated with PLMs, 34 were associated with respiratory events.  RESPIRATORY ANALYSIS:  There was a total of 48 respiratory events: 4 obstructive apneas, 8 central apneas and 4 mixed apneas with a total of 16 apneas and an apnea index (AI) of 2.5 /hour. There were 32 hypopneas with a hypopnea index of 4.9/hour. The patient also had 0 respiratory event related arousals (RERAs).      The total APNEA/HYPOPNEA INDEX  (AHI) was 7.40. /hour and the total RESPIRATORY DISTURBANCE INDEX was 7.4 0./hour  11 events occurred in REM sleep and 37 events in NREM. The REM AHI was 6.9 /hour versus a non-REM AHI of 7.6 0./hour.  The patient spent 288.5 minutes of total sleep time in the supine position and 100 minutes in non-supine. The supine AHI was 9.1, versus a non-supine AHI of 2.4.  OXYGEN SATURATION & C02:  The baseline 02 saturation was 97%, with the lowest being 88%. Time spent below 89% saturation equaled  0 minutes.  PERIODIC LIMB MOVEMENTS: The patient had a total of 0 Periodic Limb Movements. The Periodic Limb Movement (PLM) index was 0 and the PLM Arousal index was 0/hour.   Audio and video analysis did not show any abnormal or unusual movements, behaviors, phonations or vocalizations. The patient took 1 bathroom break. The EKG was in keeping with normal sinus rhythm (NSR).  Post-study, the patient indicated that sleep was the same as usual.   DIAGNOSIS  1. Obstructive Sleep Apnea   PLANS/RECOMMENDATIONS:  1. This study  demonstrates improvement of the patient's obstructive sleep apnea with CPAP therapy. I will recommend a home PAP treatment pressure of 11 cm via nasal mask with heated humidity (due to final AHI on 10 cm of 8/hour). The patient should be reminded to be fully compliant with PAP therapy to improve sleep related symptoms and decrease long term cardiovascular risks. Please note that untreated obstructive sleep apnea may carry additional perioperative morbidity. Patients with significant obstructive sleep apnea should receive perioperative PAP therapy and the surgeons and particularly the anesthesiologist should be informed of the diagnosis and the severity of the sleep disordered breathing. Other OSA treatment options (generally speaking) may include avoidance of supine sleep position along with weight loss, upper airway or jaw surgery in selected patients or the use of an oral appliance in certain patients. ENT evaluation and/or consultation with a maxillofacial surgeon or dentist may be feasible in some instances.    2. The patient should be cautioned not to drive, work at heights, or operate dangerous or heavy equipment when tired or sleepy. Review and reiteration of good sleep hygiene measures should be pursued with any patient. 3. The patient will be seen in follow-up in the sleep clinic at [facility] for discussion of the test results, symptom and treatment compliance review, further management strategies, etc. The referring provider will be notified of the test results.  I certify that I have reviewed the entire raw data recording prior to the issuance of this report in accordance with the Standards of Accreditation of the American Academy of Sleep Medicine (AASM)   Star Age, MD, PhD Diplomat, American Board of Neurology and Sleep Medicine (Neurology and Sleep Medicine)

## 2018-03-31 ENCOUNTER — Encounter (HOSPITAL_BASED_OUTPATIENT_CLINIC_OR_DEPARTMENT_OTHER): Payer: 59 | Attending: Internal Medicine

## 2018-03-31 DIAGNOSIS — G4730 Sleep apnea, unspecified: Secondary | ICD-10-CM | POA: Insufficient documentation

## 2018-03-31 DIAGNOSIS — L97221 Non-pressure chronic ulcer of left calf limited to breakdown of skin: Secondary | ICD-10-CM | POA: Diagnosis present

## 2018-03-31 DIAGNOSIS — G4733 Obstructive sleep apnea (adult) (pediatric): Secondary | ICD-10-CM | POA: Insufficient documentation

## 2018-04-07 ENCOUNTER — Encounter (HOSPITAL_BASED_OUTPATIENT_CLINIC_OR_DEPARTMENT_OTHER): Payer: 59 | Attending: Internal Medicine

## 2018-04-07 DIAGNOSIS — L97822 Non-pressure chronic ulcer of other part of left lower leg with fat layer exposed: Secondary | ICD-10-CM | POA: Insufficient documentation

## 2018-04-07 DIAGNOSIS — G4730 Sleep apnea, unspecified: Secondary | ICD-10-CM | POA: Insufficient documentation

## 2018-04-14 DIAGNOSIS — L97822 Non-pressure chronic ulcer of other part of left lower leg with fat layer exposed: Secondary | ICD-10-CM | POA: Diagnosis not present

## 2018-04-21 DIAGNOSIS — L97822 Non-pressure chronic ulcer of other part of left lower leg with fat layer exposed: Secondary | ICD-10-CM | POA: Diagnosis not present

## 2018-04-28 DIAGNOSIS — L97822 Non-pressure chronic ulcer of other part of left lower leg with fat layer exposed: Secondary | ICD-10-CM | POA: Diagnosis not present

## 2018-05-05 ENCOUNTER — Encounter (HOSPITAL_BASED_OUTPATIENT_CLINIC_OR_DEPARTMENT_OTHER): Payer: 59 | Attending: Internal Medicine

## 2018-05-05 DIAGNOSIS — L97822 Non-pressure chronic ulcer of other part of left lower leg with fat layer exposed: Secondary | ICD-10-CM | POA: Diagnosis present

## 2018-05-05 DIAGNOSIS — G4730 Sleep apnea, unspecified: Secondary | ICD-10-CM | POA: Insufficient documentation

## 2018-05-19 DIAGNOSIS — L97822 Non-pressure chronic ulcer of other part of left lower leg with fat layer exposed: Secondary | ICD-10-CM | POA: Diagnosis not present

## 2018-05-20 ENCOUNTER — Ambulatory Visit: Payer: 59 | Admitting: Nurse Practitioner

## 2018-05-21 ENCOUNTER — Ambulatory Visit: Payer: 59 | Admitting: Nurse Practitioner

## 2018-05-22 ENCOUNTER — Ambulatory Visit (INDEPENDENT_AMBULATORY_CARE_PROVIDER_SITE_OTHER): Payer: 59 | Admitting: Physician Assistant

## 2018-05-22 ENCOUNTER — Encounter: Payer: Self-pay | Admitting: Physician Assistant

## 2018-05-22 VITALS — BP 128/82 | HR 83 | Ht 69.5 in | Wt 220.2 lb

## 2018-05-22 DIAGNOSIS — K610 Anal abscess: Secondary | ICD-10-CM

## 2018-05-22 DIAGNOSIS — K603 Anal fistula: Secondary | ICD-10-CM | POA: Diagnosis not present

## 2018-05-22 MED ORDER — METRONIDAZOLE 500 MG PO TABS: 500.0000 mg | ORAL_TABLET | Freq: Two times a day (BID) | ORAL | 0 refills | Status: DC

## 2018-05-22 MED ORDER — CIPROFLOXACIN HCL 500 MG PO TABS: 500.0000 mg | ORAL_TABLET | Freq: Two times a day (BID) | ORAL | 0 refills | Status: DC

## 2018-05-22 NOTE — Progress Notes (Signed)
Agree with assessment and plan as outlined. Sorry to hear his fistula has recurred. I agree with antibiotics and he may be a prolonged course of lower dose antibiotics depending on how he does. Given his recurrent perianal fistulas, it's possible he could have Crohn's disease with only perianal involvement. Will await his surgical visit for now, and have him follow up back up with [place]

## 2018-05-22 NOTE — Progress Notes (Signed)
Subjective:    Patient ID: Elijah Robertson, male    DOB: 20-Sep-1955, 62 y.o.   MRN: [id number]  HPI Devaughn is a pleasant 62 year old white male established with Dr. Havery Moros, who was last seen here in June 2018 when he had colonoscopy.  At colonoscopy it was suspected that he had 2 small perianal for uncles there is no obvious fistula noted, his left colon diverticulosis and internal hemorrhoids.  He had one diminutive polyp removed and path showed benign polypoid mucosa. 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 He was referred to surgery/Dr. Johney Maine, and underwent surgical treatment of the anal fistula with fistulectomy and fistulotomy and intra-sphincteric lift procedure as well as excision of 2 anal canal polyps, in October 2018 Patient says that he has done well since that procedure until a couple of weeks ago when he developed a "large bump" outside of his anus which she says became quite uncomfortable especially with sitting.  He says this to have "burst" 2 nights ago while he was sleeping, and he woke up with blood and yellowish liquid in his underwear and He says the pressure has been less since then He has not had any associated fevers or chills.  He denies any problems with his bowel movements and says that he goes regularly and is not having any straining or hard stools.  He has no complaints of abdominal discomfort. He had called here to be seen, and also has appointment with Dr. Johney Maine next week.  Review of Systems Pertinent positive and negative review of systems were noted in the above HPI section.  All other review of systems was otherwise negative.  Outpatient Encounter Medications as of 05/22/2018  Medication Sig  . acetaminophen (TYLENOL) 650 MG CR tablet Take 1,950 mg by mouth every 8 (eight) hours as  needed for pain.  Marland Kitchen aspirin EC 81 MG tablet Take 81 mg by mouth daily.  . cetirizine (ZYRTEC) 10 MG tablet Take 10 mg by mouth daily.  Marland Kitchen EPINEPHrine 0.3 mg/0.3 mL IJ SOAJ injection Inject 0.3 mLs (0.3 mg total) into the muscle once as needed (anaphylaxis/allergic reaction).  Marland Kitchen ketoconazole (NIZORAL) 2 % cream Apply 1 application topically daily.  . montelukast (SINGULAIR) 10 MG tablet Take 10 mg by mouth at bedtime.  . ranitidine (ZANTAC) 150 MG tablet Take 150 mg by mouth 2 (two) times daily.  . sildenafil (REVATIO) 20 MG tablet Take 1-5 tablets by mouth daily as needed for erectile dysfunction.  Marland Kitchen zinc gluconate 50 MG tablet Take 100 mg by mouth daily.  . ciprofloxacin (CIPRO) 500 MG tablet Take 1 tablet (500 mg total) by mouth 2 (two) times daily.  . metroNIDAZOLE (FLAGYL) 500 MG tablet Take 1 tablet (500 mg total) by mouth 2 (two) times daily.  . [DISCONTINUED] atorvastatin (LIPITOR) 20 MG tablet Take 1 tablet (20 mg total) by mouth daily.  . [DISCONTINUED] [employer] SALINE NASAL SPRAY NA Place 1 spray into the nose 2 (two) times daily.   . [DISCONTINUED] desloratadine (CLARINEX) 5 MG tablet Take 1 tablet (5 mg total) by mouth daily.  . [DISCONTINUED] famotidine (PEPCID) 20 MG tablet Take 1 tablet (20 mg total) by mouth 2 (two) times daily for 3 days.  . [DISCONTINUED] predniSONE (STERAPRED UNI-PAK 21 TAB) 10 MG (21) TBPK tablet Take 6 tabs by mouth daily  for 2 days, then 5 tabs  for 2 days, then 4 tabs for 2 days, then 3 tabs for 2 days, 2 tabs for 2 days, then 1 tab by mouth daily for 2 days  . [DISCONTINUED] saw palmetto 160 MG capsule Take 800 mg by mouth daily.   . [DISCONTINUED] Turmeric 500 MG CAPS Take 500 mg by mouth daily.    No facility-administered encounter medications on file as of 05/22/2018.    Allergies  Allergen Reactions  . Turmeric     pt believes he is allergic to turmeric with uncomfortable feeling   Patient Active Problem List   Diagnosis Date Noted  . Rash 02/19/2018   . Routine health maintenance 10/28/2016  . Erectile dysfunction due to diseases classified elsewhere 09/11/2016  . Snoring 09/11/2016  . Polyp of colon 09/11/2016  . Elevated blood pressure reading 09/11/2016  . Obesity 09/11/2016   Social History   Socioeconomic History  . Marital status: Single    Spouse name: Not on file  . Number of children: 3  . Years of education: 40  . Highest education level: Not on file  Occupational History  . Not on file  Social Needs  . Financial resource strain: Not on file  . Food insecurity:    Worry: Not on file    Inability: Not on file  . Transportation needs:    Medical: Not on file    Non-medical: Not on file  Tobacco Use  . Smoking status: Former Smoker    Packs/day: 1.00    Years: 32.00    Pack years: 32.00    Types: Cigarettes    Last attempt to quit: 08/05/2010    Years since quitting: 7.8  . Smokeless tobacco: Never Used  Substance and Sexual Activity  . Alcohol use: Yes    Alcohol/week: 2.0 - 3.0 standard drinks    Types: 2 - 3 Glasses of wine per week  . Drug use: Yes    Types: Marijuana    Comment: 1x every couple of months,FEB 2018  . Sexual activity: Yes  Lifestyle  . Physical activity:    Days per week: Not on file    Minutes per session: Not on file  . Stress: Not on file  Relationships  . Social connections:    Talks on phone: Not on file    Gets together: Not on file    Attends religious service: Not on file    Active member of club or organization: Not on file    Attends meetings of clubs or organizations: Not on file    Relationship status: Not on file  . Intimate partner violence:    Fear of current or ex partner: Not on file    Emotionally abused: Not on file    Physically abused: Not on file    Forced sexual activity: Not on file  Other Topics Concern  . Not on file  Social History Narrative   Fun/Hobby: Engineer, civil (consulting) sports, hang out with friends; Doctor, general practice; Travel    Mr. Pore family  history includes Breast cancer in his mother; Healthy in his maternal grandfather, maternal grandmother, and paternal grandmother.      Objective:    Vitals:   05/22/18 0828  BP: 128/82  Pulse: 83    Physical Exam well-developed older white male in no acute distress, pleasant, BMI 32.0.  HEENT; nontraumatic normocephalic EOMI PERRLA sclera anicteric oral mucosa moist, Cardiovascular; regular rate and rhythm with S1-S2 no murmur rub gallop, Pulmonary; clear bilaterally, Abdomen; soft, nontender nondistended bowel  sounds are active no palpable mass or hepatosplenomegaly, Rectal ;exam; there appears to be a fistula left perianal area which drains small amount of fluid with pressure, surrounding the fistula there is a large area of erythema, and a ridge of induration, there is no fluctuance to this area on digital exam there is fullness inside the anus on the left, no palpable abscess or fluctuation, and patient does not have any significant tenderness. Extremities; no clubbing cyanosis or edema skin warm dry, Neuro psych alert and oriented, grossly nonfocal mood and affect appropriate       Assessment & Plan:   #24 62 year old white male with recurrent left perianal fistula.  He has had associated small abscess which has self decompressed.  #2 colon cancer surveillance-up-to-date last colonoscopy June 2018, no adenomatous polyps 3 diverticulosis  Plan; will start Cipro 500 mg p.o. twice daily and Flagyl 500 mg p.o. twice daily each x7 days. Patient is asked to soak in the bathtub over the next 2 to 3 days, twice daily for 15 to 20 minutes in hot water. Fortunately he already has appointment with surgery scheduled for early next week/Dr. Johney Maine, who can decide if repeat surgery is indicated.  Jakera Beaupre S Moet Mikulski PA-C 05/22/2018   Cc: Lance Sell, NP

## 2018-05-22 NOTE — Patient Instructions (Addendum)
If you are age 60 or older, your body mass index should be between 23-30. Your Body mass index is 32.06 kg/m. If this is out of the aforementioned range listed, please consider follow up with your Primary Care Provider.  If you are age 71 or younger, your body mass index should be between 19-25. Your Body mass index is 32.06 kg/m. If this is out of the aformentioned range listed, please consider follow up with your Primary Care Provider.   We have sent the following medications to your pharmacy for you to pick up at your convenience: Cipro and Flagyl   Soak in the tub 15-20 minutes, three times a day for the next 2-3 days.  Keep the scheduled appointment with Dr Johney Maine @ [facility]  It was a pleasure to see you today!

## 2018-05-25 ENCOUNTER — Encounter: Payer: Self-pay | Admitting: Surgery

## 2018-05-25 DIAGNOSIS — K603 Anal fistula, unspecified: Secondary | ICD-10-CM

## 2018-05-25 DIAGNOSIS — K611 Rectal abscess: Secondary | ICD-10-CM | POA: Insufficient documentation

## 2018-05-25 DIAGNOSIS — Z8379 Family history of other diseases of the digestive system: Secondary | ICD-10-CM | POA: Insufficient documentation

## 2018-05-25 HISTORY — DX: Anal fistula, unspecified: K60.30

## 2018-05-25 HISTORY — DX: Anal fistula: K60.3

## 2018-05-27 ENCOUNTER — Ambulatory Visit (INDEPENDENT_AMBULATORY_CARE_PROVIDER_SITE_OTHER): Payer: 59

## 2018-05-27 DIAGNOSIS — Z23 Encounter for immunization: Secondary | ICD-10-CM | POA: Diagnosis not present

## 2018-06-02 DIAGNOSIS — L97822 Non-pressure chronic ulcer of other part of left lower leg with fat layer exposed: Secondary | ICD-10-CM | POA: Diagnosis not present

## 2018-06-05 ENCOUNTER — Telehealth: Payer: Self-pay

## 2018-06-05 NOTE — Telephone Encounter (Signed)
Left message for patient that we had received a message from Dr. Johney Maine, concerned about possible Crohn's disease and recommending CT or MR enterography. Called and asked patient to contact our office if he is interested in getting this scheduled.

## 2018-06-09 ENCOUNTER — Telehealth: Payer: Self-pay | Admitting: Gastroenterology

## 2018-06-09 NOTE — Telephone Encounter (Signed)
Almyra Free this patient has had recurrent perianal fistula / abscess, recently seen by Dr. Johney Maine. His course is concerning for Crohn's disease but his colonoscopy was normal last year. Dr. Johney Maine has been in touch with me, I think he warrants further workup for Crohn's. I'm recommending a CT enterography study to evaluate his small bowel for active inflammation. Can you let him know I am recommending this if you can help coordinate, and that we are doing this to assess for underlying Crohn's disease. Thanks

## 2018-06-10 ENCOUNTER — Other Ambulatory Visit: Payer: Self-pay

## 2018-06-10 DIAGNOSIS — K6289 Other specified diseases of anus and rectum: Secondary | ICD-10-CM

## 2018-06-10 DIAGNOSIS — K610 Anal abscess: Secondary | ICD-10-CM

## 2018-06-10 NOTE — Telephone Encounter (Signed)
I had left a detailed message for patient on 06/05/18, also lvm again today asking patient to call back to let us know if he wishes to proceed with CT enterography.

## 2018-06-12 ENCOUNTER — Other Ambulatory Visit: Payer: Self-pay

## 2018-06-12 ENCOUNTER — Other Ambulatory Visit: Payer: 59

## 2018-06-12 DIAGNOSIS — K610 Anal abscess: Secondary | ICD-10-CM

## 2018-06-24 IMAGING — CT CT CHEST LUNG CANCER SCREENING LOW DOSE W/O CM
2 of 5 series · 15 of 40 positions shown, 18 images · non-contrast
Comparison: None.

CLINICAL DATA: 61-year-old asymptomatic male former smoker with 32
pack-year smoking history, quit smoking in 4854.

EXAM:
CT CHEST WITHOUT CONTRAST LOW-DOSE FOR LUNG CANCER SCREENING
TECHNIQUE: Multidetector CT imaging of the chest was performed following the
standard protocol without IV contrast.

[Series 3: lung thins 1.0 · axial · 0.81mm/px · z∈[-314,-29]mm · 12 of 315 slices shown, 15 images]
[im 15/315  mediastinal]
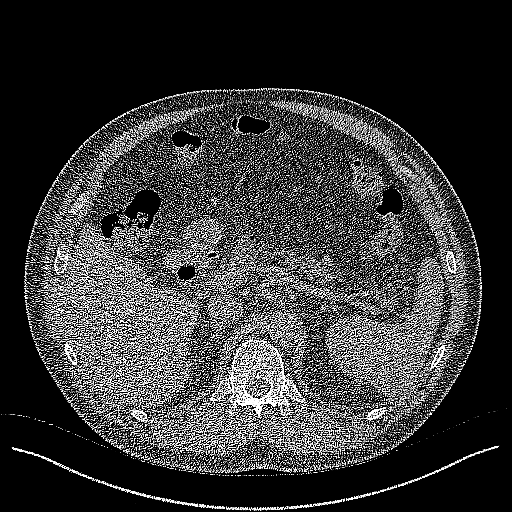
[im 15/315  lung]
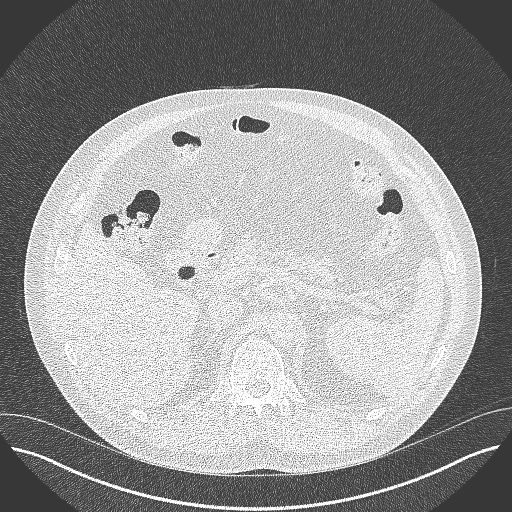
[im 43/315  lung]
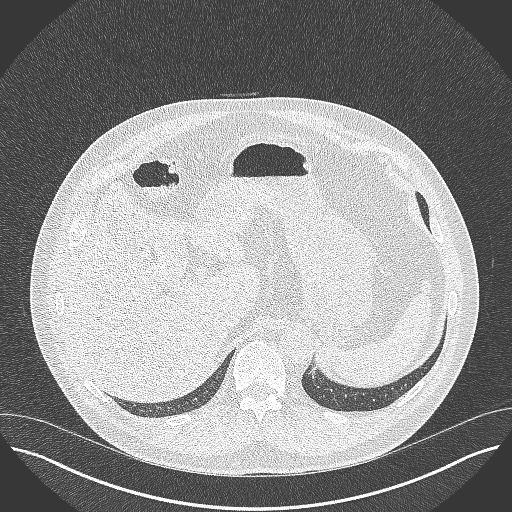
[im 72/315  lung]
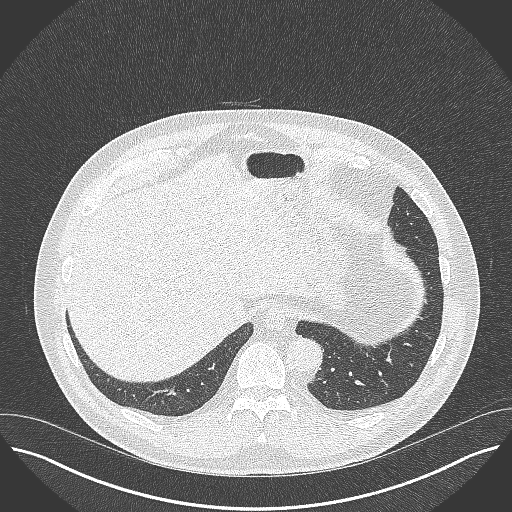
[im 100/315  lung]
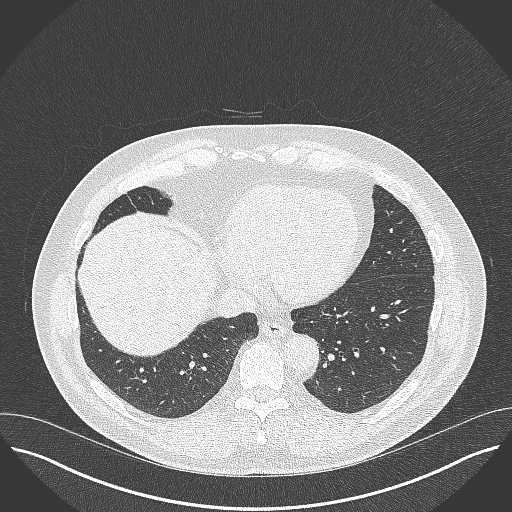
[im 115/315  mediastinal]
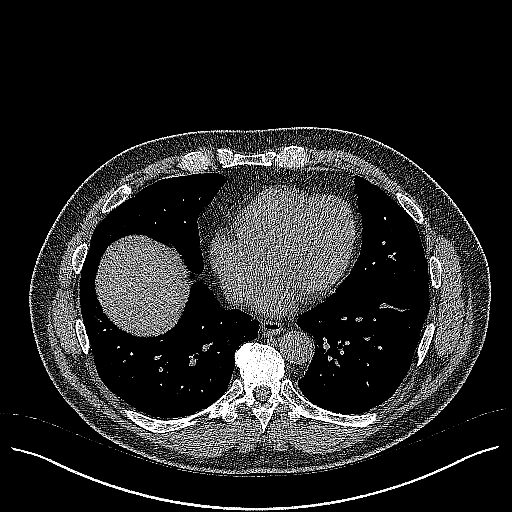
[im 115/315  lung]
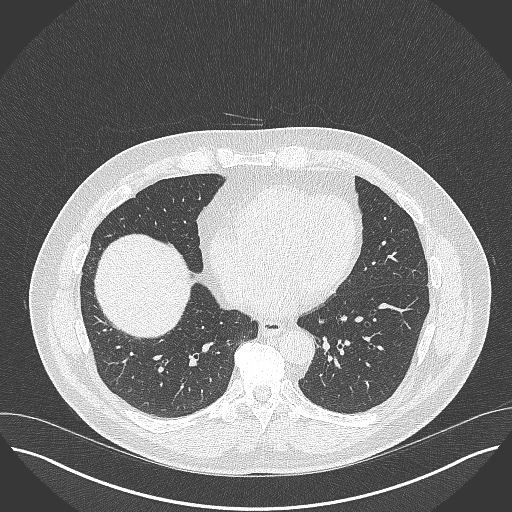
[im 143/315  lung]
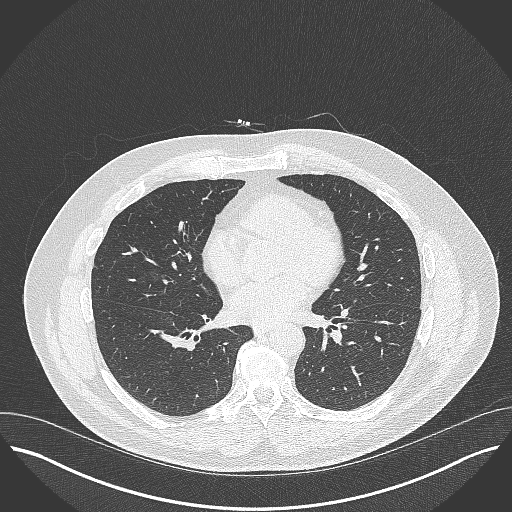
[im 172/315  lung]
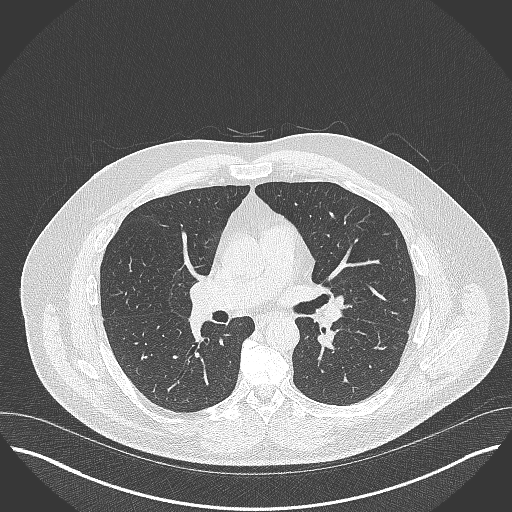
[im 200/315  lung]
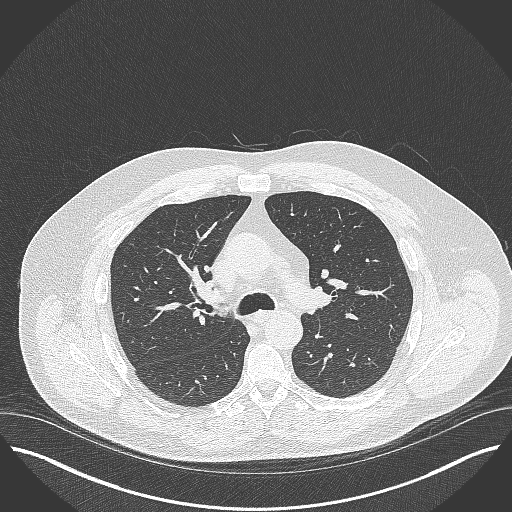
[im 215/315  mediastinal]
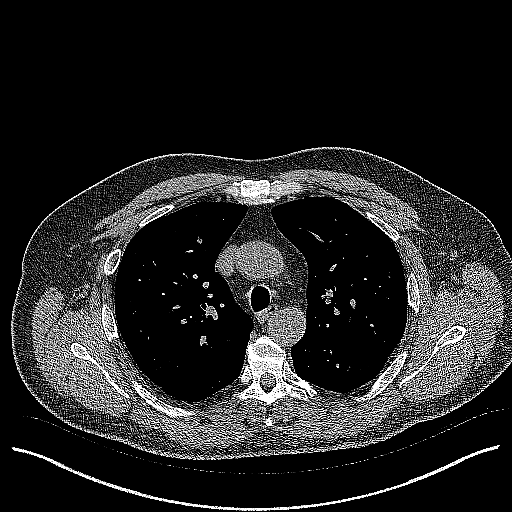
[im 215/315  lung]
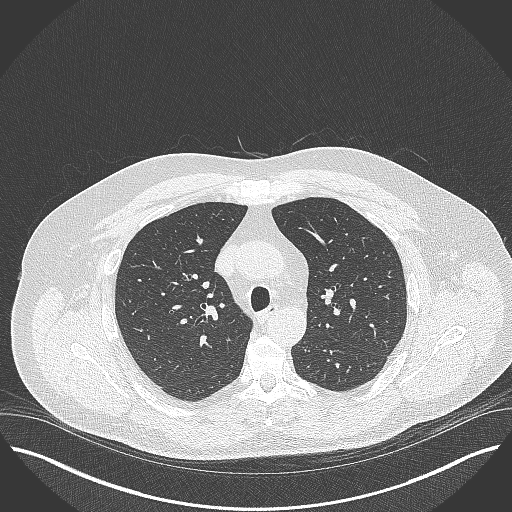
[im 243/315  lung]
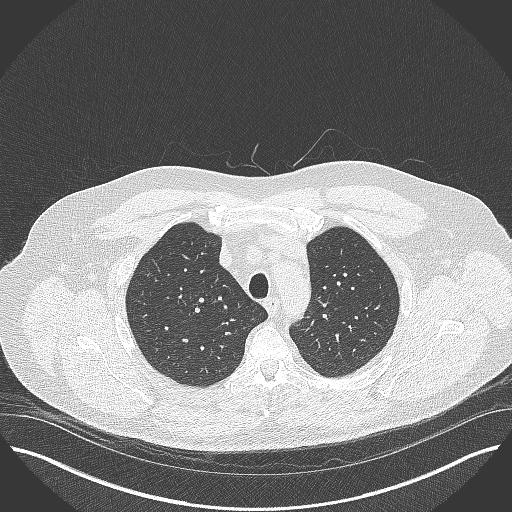
[im 272/315  lung]
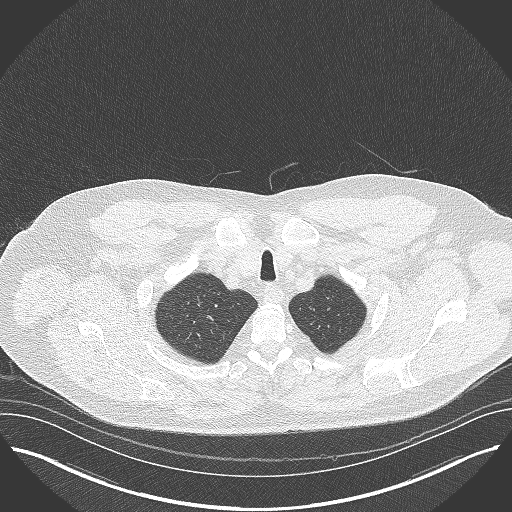
[im 300/315  lung]
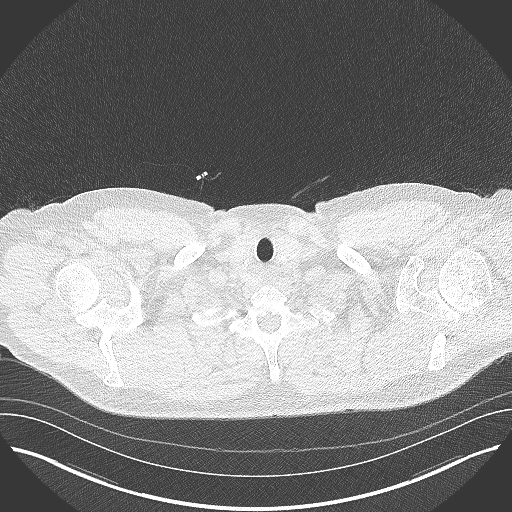

[Series 5: coronal · coronal · 0.62mm/px · 3 of 128 slices shown]
[im 26/128  lung]
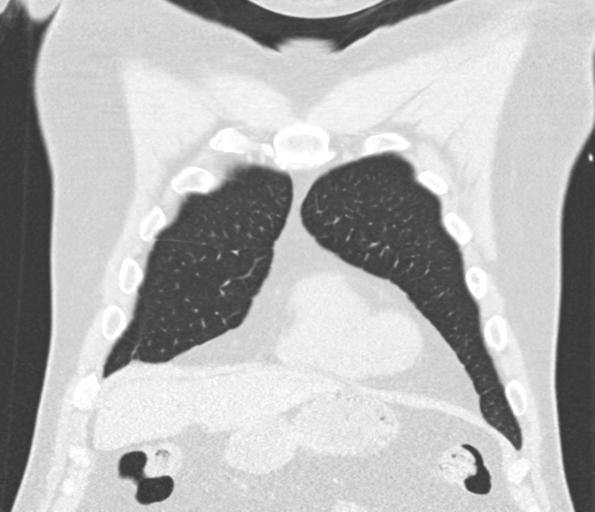
[im 51/128  lung]
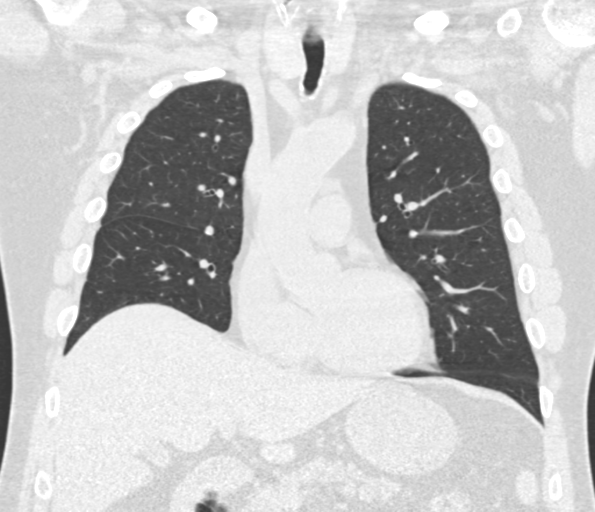
[im 77/128  lung]
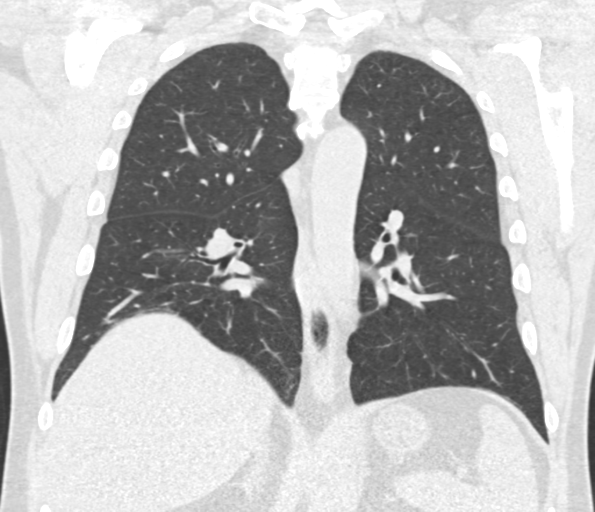

[15 of 40 positions shown; findings below may reference images not displayed]

FINDINGS: Cardiovascular: Normal heart size. No significant pericardial
fluid/thickening. Right coronary atherosclerosis. Mildly
atherosclerotic nonaneurysmal thoracic aorta. Normal caliber
pulmonary arteries.

Mediastinum/Nodes: No discrete thyroid nodules. Unremarkable
esophagus. No pathologically enlarged axillary, mediastinal or gross
hilar lymph nodes, noting limited sensitivity for the detection of
hilar adenopathy on this noncontrast study.

Lungs/Pleura: No pneumothorax. No pleural effusion. No acute
consolidative airspace disease or lung masses. Solitary subpleural
solid posterior left upper lobe pulmonary nodule measuring 2.7 mm in
volume derived mean diameter (series 3/image 132). No additional
significant pulmonary nodules.

Upper abdomen: No acute abnormality.

Musculoskeletal: No aggressive appearing focal osseous lesions.
Moderate thoracic spondylosis.
IMPRESSION: 1. Lung-RADS 2, benign appearance or behavior. Continue annual
screening with low-dose chest CT without contrast in 12 months.
2. One vessel coronary atherosclerosis.

Aortic Atherosclerosis ([id number].W).

## 2018-06-26 ENCOUNTER — Inpatient Hospital Stay: Admission: RE | Admit: 2018-06-26 | Payer: 59 | Source: Ambulatory Visit

## 2018-07-08 ENCOUNTER — Other Ambulatory Visit (INDEPENDENT_AMBULATORY_CARE_PROVIDER_SITE_OTHER): Payer: 59

## 2018-07-08 DIAGNOSIS — K610 Anal abscess: Secondary | ICD-10-CM

## 2018-07-08 LAB — CREATININE, SERUM: CREATININE: 1.17 mg/dL (ref 0.40–1.50)

## 2018-07-08 LAB — BUN: BUN: 20 mg/dL (ref 6–23)

## 2018-07-10 ENCOUNTER — Ambulatory Visit (INDEPENDENT_AMBULATORY_CARE_PROVIDER_SITE_OTHER)
Admission: RE | Admit: 2018-07-10 | Discharge: 2018-07-10 | Disposition: A | Discharge status: Home / Self Care | Payer: 59 | Source: Ambulatory Visit | Attending: Gastroenterology | Admitting: Gastroenterology

## 2018-07-10 DIAGNOSIS — K6289 Other specified diseases of anus and rectum: Secondary | ICD-10-CM | POA: Diagnosis not present

## 2018-07-10 DIAGNOSIS — K610 Anal abscess: Secondary | ICD-10-CM

## 2018-07-10 MED ORDER — IOPAMIDOL (ISOVUE-300) INJECTION 61%
100.0000 mL | Freq: Once | INTRAVENOUS | Status: AC | PRN
  Administered 2018-07-10: 100 mL via INTRAVENOUS

## 2018-07-15 ENCOUNTER — Ambulatory Visit: Payer: 59 | Admitting: Nurse Practitioner

## 2018-08-21 ENCOUNTER — Ambulatory Visit (INDEPENDENT_AMBULATORY_CARE_PROVIDER_SITE_OTHER): Payer: 59 | Admitting: Gastroenterology

## 2018-08-21 ENCOUNTER — Encounter: Payer: Self-pay | Admitting: Gastroenterology

## 2018-08-21 VITALS — BP 130/77 | HR 70 | Ht 70.0 in | Wt 217.6 lb

## 2018-08-21 DIAGNOSIS — K603 Anal fistula: Secondary | ICD-10-CM | POA: Diagnosis not present

## 2018-08-21 MED ORDER — METRONIDAZOLE 500 MG PO TABS: 500.0000 mg | ORAL_TABLET | Freq: Two times a day (BID) | ORAL | 0 refills | Status: DC

## 2018-08-21 MED ORDER — CIPROFLOXACIN HCL 500 MG PO TABS: 500.0000 mg | ORAL_TABLET | Freq: Two times a day (BID) | ORAL | 0 refills | Status: DC

## 2018-08-21 NOTE — Progress Notes (Signed)
HPI :  63 year old man here for a follow-up.  I've seen him previously for a colonoscopy in June 2018 when he had colonoscopy.  At colonoscopy it was suspected that he had 2 small perianal furuncles with possible underlying fistula, his left colon diverticulosis and internal hemorrhoids.  He had one diminutive polyp removed and path showed benign polypoid mucosa. 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 He subsequently was seen by Dr. Johney Maine in October 2018 and had  fistulectomy and fistulotomy and intra-sphincteric lift procedure as well as excision of 2 anal canal polyps, in October 2018.  He came back to our office again in October 2019 with another perianal abscess. Given antibiotics, seen again by surgery who lanced it. We then sent him for a CT enterography to assess for any evidence of inflammatory bowel disease. Small bowel and colon looked normal. He did have soft tissue thicekning of intersphincteric space - concern for possible scar versus recurrent fistula.   He reports today he has some ongoing irritation and perianal area. He thinks it drains blood at times. He denies any constipation or diarrhea otherwise. He denies any significant perianal discomfort or fevers. He reports this is more of an annoyance it has not resolved. His daughter has ulcerative colitis on Humira.  Past Medical History:  Diagnosis Date  . Diverticulosis   . GERD (gastroesophageal reflux disease)   . Hemorrhoids   . Hives   . Perianal fistula   . Seizures ([place])    Childhood - none in adulthood  . Sleep apnea      Past Surgical History:  Procedure Laterality Date  . COLONOSCOPY W/ POLYPECTOMY    . NOSE SURGERY     Septal surgery  . RECTAL SURGERY  05/2017   fistula repair   Family History  Problem Relation Age of Onset  .  Breast cancer Mother   . Healthy Maternal Grandmother   . Healthy Maternal Grandfather   . Healthy Paternal Grandmother   . Colon cancer Neg Hx    Social History   Tobacco Use  . Smoking status: Former Smoker    Packs/day: 1.00    Years: 32.00    Pack years: 32.00    Types: Cigarettes    Last attempt to quit: 08/05/2010    Years since quitting: 8.0  . Smokeless tobacco: Never Used  Substance Use Topics  . Alcohol use: Yes    Alcohol/week: 2.0 - 3.0 standard drinks    Types: 2 - 3 Glasses of wine per week  . Drug use: Yes    Types: Marijuana    Comment: 1x every couple of months,FEB 2018   Current Outpatient Medications  Medication Sig Dispense Refill  . acetaminophen (TYLENOL) 650 MG CR tablet Take 1,950 mg by mouth every 8 (eight) hours as needed for pain.    Marland Kitchen aspirin EC 81 MG tablet Take 81 mg by mouth daily.    Marland Kitchen EPINEPHrine 0.3 mg/0.3 mL IJ SOAJ injection Inject 0.3 mLs (0.3 mg total) into the muscle once as needed (anaphylaxis/allergic reaction). 1 Device 0  . ketoconazole (NIZORAL) 2 % cream Apply 1 application topically daily. 15 g 2  . sildenafil (REVATIO) 20 MG tablet Take 1-5 tablets by mouth daily as needed for erectile dysfunction. 50 tablet 1  . zinc gluconate 50 MG tablet Take 100 mg by mouth daily.  No current facility-administered medications for this visit.    Allergies  Allergen Reactions  . Turmeric     pt believes he is allergic to turmeric with uncomfortable feeling     Review of Systems: All systems reviewed and negative except where noted in HPI.   Lab Results  Component Value Date   WBC 12.5 (H) 02/22/2018   HGB 14.3 02/22/2018   HCT 42.9 02/22/2018   MCV 94.7 02/22/2018   PLT 296 02/22/2018    Lab Results  Component Value Date   CREATININE 1.17 07/08/2018   BUN 20 07/08/2018   NA 142 02/22/2018   K 3.8 02/22/2018   CL 106 02/22/2018   CO2 26 02/22/2018    Lab Results  Component Value Date   ALT 25 02/22/2018   AST 19  02/22/2018   ALKPHOS 53 02/22/2018   BILITOT 0.9 02/22/2018     Physical Exam: BP 130/77   Pulse 70   Ht 5\' 10"  (1.778 m)   Wt 217 lb 9.6 oz (98.7 kg)   SpO2 94%   BMI 31.22 kg/m  Constitutional: Pleasant,well-developed, male in no acute distress. HEENT: Normocephalic and atraumatic. Conjunctivae are normal. No scleral icterus. Neck supple.  Cardiovascular: Normal rate, regular rhythm.  Pulmonary/chest: Effort normal and breath sounds normal. No wheezing, rales or rhonchi. Abdominal: Soft, nondistended, nontender. There are no masses palpable. No hepatomegaly. DRE - standby Tia Alert - suspected perianal fistula in the left lateral position, with some induration, no mass lesions, purulent drainage expressed from fistulous tract with palpation  Extremities: no edema Lymphadenopathy: No cervical adenopathy noted. Neurological: Alert and oriented to person place and time. Skin: Skin is warm and dry. No rashes noted. Psychiatric: Normal mood and affect. Behavior is normal.   ASSESSMENT AND PLAN: 63 year old male here for reassessment following issues:  Recurrent perianal fistula / abscess - multiple occurrences over the past 2 years status post surgery. On exam today I'm concerned he has a recurrent perianal fistula and he has some obvious purulent drainage from it on exam today. He is at risk for developing another abscess. I'm going to start him on Cipro 500 twice a day and Flagyl 500 twice a day for the next 2 weeks or so to help treat this. Otherwise I discussed long-term management. It is quite possible he has perianal Crohn's disease without other luminal involvement. I discussed the role of medical versus surgical therapy in this situation. It may be reasonable to give him a trial of anti-TNF to see if we can help heal the fistula and prevent recurrent abscess. After discussion of anti-TNF therapy, he strongly wishes to avoid this if at all possible and declined it. He is going to  follow up with Dr. Johney Maine of colorectal surgery. I will touch base with Dr. Johney Maine to see if he would want a pelvic MRI to assess for extent of fistulas, or if he wishes to see him in clinic first. I think he may need a seton placed if he's going to decline medical therapy. I will get back to the patient shortly. If he develops symptoms of an abscess in the interim he will contact me.   [place] Cellar, MD [facility]

## 2018-08-21 NOTE — Patient Instructions (Addendum)
If you are age 61 or older, your body mass index should be between 23-30. Your Body mass index is 31.22 kg/m. If this is out of the aforementioned range listed, please consider follow up with your Primary Care Provider.  If you are age 71 or younger, your body mass index should be between 19-25. Your Body mass index is 31.22 kg/m. If this is out of the aformentioned range listed, please consider follow up with your Primary Care Provider.   We have sent the following medications to your pharmacy for you to pick up at your convenience: Cipro 500mg : Take twice a day for 2 weeks Flagyl 500mg : Take twice a day for 2 weeks    Thank you for entrusting me with your care and for choosing [facility], Dr. [place] Cellar

## 2018-08-24 ENCOUNTER — Telehealth: Payer: Self-pay | Admitting: Gastroenterology

## 2018-08-24 DIAGNOSIS — K610 Anal abscess: Secondary | ICD-10-CM

## 2018-08-24 DIAGNOSIS — K603 Anal fistula: Secondary | ICD-10-CM

## 2018-08-24 NOTE — Telephone Encounter (Signed)
Patient has been scheduled for MRI pelvis on 09/02/18 8:30 arrival and be NPO after midnight at [facility]. He verbalized understanding

## 2018-08-24 NOTE — Telephone Encounter (Signed)
Elijah Robertson can you let the patient know I spoke with Dr. Johney Maine. We think it is a good idea to proceed with an MRI of the pelvis to assess for perianal fistula, extent of disease. Can you help coordinate this for Elijah Robertson? He will otherwise follow up with Dr. Johney Maine. Thanks

## 2018-08-24 NOTE — Addendum Note (Signed)
Addended by: Marlon Pel on: 08/24/2018 02:52 PM   Modules accepted: Orders

## 2018-09-02 ENCOUNTER — Ambulatory Visit (HOSPITAL_COMMUNITY)
Admission: RE | Admit: 2018-09-02 | Discharge: 2018-09-02 | Disposition: A | Discharge status: Home / Self Care | Payer: 59 | Source: Ambulatory Visit | Attending: Gastroenterology | Admitting: Gastroenterology

## 2018-09-02 ENCOUNTER — Other Ambulatory Visit: Payer: Self-pay | Admitting: Gastroenterology

## 2018-09-02 DIAGNOSIS — K603 Anal fistula: Secondary | ICD-10-CM

## 2018-09-02 DIAGNOSIS — K610 Anal abscess: Secondary | ICD-10-CM | POA: Diagnosis present

## 2018-09-02 MED ORDER — GADOBUTROL 1 MMOL/ML IV SOLN
9.0000 mL | Freq: Once | INTRAVENOUS | Status: AC | PRN
  Administered 2018-09-02: 9 mL via INTRAVENOUS

## 2018-09-03 ENCOUNTER — Other Ambulatory Visit: Payer: Self-pay

## 2018-09-03 ENCOUNTER — Telehealth: Payer: Self-pay | Admitting: Gastroenterology

## 2018-09-03 DIAGNOSIS — K610 Anal abscess: Secondary | ICD-10-CM

## 2018-09-03 DIAGNOSIS — K603 Anal fistula: Secondary | ICD-10-CM

## 2018-09-03 MED ORDER — CIPROFLOXACIN HCL 500 MG PO TABS: 500.0000 mg | ORAL_TABLET | Freq: Two times a day (BID) | ORAL | 0 refills | Status: AC

## 2018-09-03 MED ORDER — METRONIDAZOLE 500 MG PO TABS: 500.0000 mg | ORAL_TABLET | Freq: Two times a day (BID) | ORAL | 0 refills | Status: AC

## 2018-09-03 NOTE — Progress Notes (Addendum)
Orders placed in Epic for medications and lab work;   Message sent to referral contact at [facility] for update on patient having already been/needing to be scheduled for a f/u with Dr. Johney Maine for dx; awaiting response at this time;  Called and spoke with patient-patient informed of MD recommendations; patient is agreeable with plan of care; Patient made aware that lab work needed to be completed -preferably prior to follow up with Dr. Johney Maine; Patient also informed RX's sent to pharmacy per MD recommendations; Patient verbalized understanding of information/instructions;  Patient was advised to call the office at [phone] if questions/concerns arise;

## 2018-09-03 NOTE — Telephone Encounter (Signed)
Please see additional documentation concerning this patient 

## 2018-09-03 NOTE — Telephone Encounter (Signed)
Pt is returning your call

## 2018-09-07 ENCOUNTER — Other Ambulatory Visit (INDEPENDENT_AMBULATORY_CARE_PROVIDER_SITE_OTHER): Payer: 59

## 2018-09-07 DIAGNOSIS — K603 Anal fistula: Secondary | ICD-10-CM

## 2018-09-07 DIAGNOSIS — K610 Anal abscess: Secondary | ICD-10-CM

## 2018-09-07 LAB — URINALYSIS
Bilirubin Urine: NEGATIVE
Hgb urine dipstick: NEGATIVE
KETONES UR: NEGATIVE
LEUKOCYTES UA: NEGATIVE
Nitrite: NEGATIVE
PH: 6 (ref 5.0–8.0)
SPECIFIC GRAVITY, URINE: 1.015 (ref 1.000–1.030)
Total Protein, Urine: NEGATIVE
Urine Glucose: NEGATIVE
Urobilinogen, UA: 0.2 (ref 0.0–1.0)

## 2018-09-07 NOTE — Progress Notes (Unsigned)
URIN 

## 2018-09-08 LAB — URINE CULTURE
MICRO NUMBER: 141561
Result:: NO GROWTH
SPECIMEN QUALITY: ADEQUATE

## 2018-09-08 LAB — URINALYSIS

## 2018-09-21 ENCOUNTER — Ambulatory Visit: Payer: Self-pay | Admitting: Surgery

## 2018-09-21 NOTE — H&P (Signed)
Elijah Robertson Documented: 09/21/2018 10:05 AM Location: [facility] Patient #: [id number] DOB: [birth year] Single / Language: Cleophus Molt / Race: White Male  History of Present Illness Elijah Hector MD; 09/21/2018 2:18 PM) The patient is a 63 year old male who presents with anal fistula. Note for "Anal fistula": ` ` ` The patient returns s/p repair of Left perirectal intersphincteric fistula repair (LIFT). Excision of polyp. Date of procedure: 05/16/2017 Pathology: Benign fistulous tract. Benign fibroepithelial anal polyp  I&D new left posterior perirectal abscess 05/25/2018  patient returns with some persistent moisture. He followed up with gastroenterology. CT enterography within normal limits. MRI pelvis confirmed perirectal fistula. Seem solitary and probably involving the sphincter. No undrained collections. No water can changes. coronary the patient, his gastroenterology doubted that the patient has a diagnosis of Crohn's disease the did discuss the theoretical need for Humira like his daughter with inflammatory bowel disease. Dr. Havery Moros did not wish to repeat colonoscopy. The patient did not want to start any immunosuppression. He returns to consider options. Denies any fevers or chills. He is trying to lose weight but remains overweight. Moving his bowels a few times a day. No severe diarrhea. No rectal bleeding. No urgency.       Prior Notes: Patient returns today feeling better. Completed antibiotics. Drainage and pain of abscess has gone down. Moving his bowels about twice a day. No fevers chills or sweats. No major bleeding. No new painful areas. Got a call from gastroenterology, wondering if further testing needed.    Is been almost a year since I last saw the patient. He notes that the area had remain healed and done well. However 3 weeks ago, he felt a lump again on his left perianal side. It then spontaneously drained 4 days ago.  Some thin pus and blood. Concerned him. Saw gastroenterology the next day. Recurrent abscess/fistula suspected. Placed on oral antibiotics. He comes in the next business day, Monday to see me. He feels like it is draining okay. His bowels are a little more loose on the Cipro and Flagyl antibiotics. Going about 2-3 times a day. Normally moves his bowels once or twice a day. He's been taking his Metamucil. Denies any irregular bowels or straining.  No personal nor family history of GI/colon cancer, irritable bowel syndrome, allergy such as Celiac Sprue, dietary/dairy problems, colitis, ulcers nor gastritis. No recent sick contacts/gastroenteritis. No travel outside the country. No changes in diet. No dysphagia to solids or liquids. No significant heartburn or reflux. No hematochezia, hematemesis, coffee ground emesis. No evidence of prior gastric/peptic ulceration. 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) ENCOUNTER FOR PREOPERATIVE EXAMINATION FOR GENERAL SURGICAL PROCEDURE (Z01.818) PROLAPSED INTERNAL HEMORRHOIDS, GRADE 2 (K64.1) DIASTASIS RECTI (M62.08) INCARCERATED UMBILICAL HERNIA (W10.2) ANAL POLYP (K62.0) F/U PRN - HISTORY OF RECTAL SURGERY (Z98.890) ANAL ABSCESS (V25.3) UMBILICAL HERNIA WITHOUT OBSTRUCTION AND WITHOUT GANGRENE (K42.9)  Past Surgical History Elijah Hector, MD; 09/21/2018 10:22 AM) Colon Polyp Removal - Colonoscopy  Diagnostic Studies History Elijah Hector, MD; 09/21/2018 10:22 AM) Colonoscopy 1-5 years ago  Allergies Sabino Gasser, CMA; 09/21/2018 10:05 AM) No Known Drug Allergies [03/06/2017]: Allergies Reconciled  Medication History Sabino Gasser, CMA; 09/21/2018 10:06 AM) Ketoconazole (External) Specific strength unknown - Active. Medications Reconciled Zinc Gluconate (100MG  Tablet, Oral)  Active. Ciprofloxacin HCl (500MG  Tablet, Oral) Active. metroNIDAZOLE (500MG  Tablet, Oral) Active. Sildenafil Citrate (20MG  Tablet, Oral) Active. Aspirin (  81MG  Tablet, Oral) Active.  Social History Elijah Hector, MD; 09/21/2018 10:22 AM) Alcohol use Moderate alcohol use. Caffeine use Carbonated beverages, Coffee. Illicit drug use Uses socially only. Tobacco use Former smoker.  Family History Elijah Hector, MD; 09/21/2018 10:22 AM) Breast Cancer Mother. Hypertension Father. Ischemic Bowel Disease Daughter.  Other Problems Elijah Hector, MD; 09/21/2018 10:22 AM) Back Pain Hemorrhoids Seizure Disorder    Vitals Sabino Gasser CMA; 09/21/2018 10:07 AM) 09/21/2018 10:06 AM Weight: 215.31 lb Height: 69in Body Surface Area: 2.13 m Body Mass Index: 31.8 kg/m  Temp.: 96.66F(Oral)  Pulse: 95 (Regular)  BP: 124/82 (Sitting, Left Arm, Standard)      Physical Exam Elijah Hector MD; 09/21/2018 2:19 PM)  General Mental Status-Alert. General Appearance-Not in acute distress. Voice-Normal.  Integumentary Global Assessment Normal Exam - Distribution of scalp and body hair is normal. General Characteristics Overall Skin Surface - no rashes and no suspicious lesions.  Head and Neck Head-normocephalic, atraumatic with no lesions or palpable masses. Face Global Assessment - atraumatic, no absence of expression. Neck Global Assessment - no abnormal movements, no decreased range of motion. Trachea-midline. Thyroid Gland Characteristics - non-tender.  Eye Eyeball - Left-Extraocular movements intact, No Nystagmus. Eyeball - Right-Extraocular movements intact, No Nystagmus. Upper Eyelid - Left-No Cyanotic. Upper Eyelid - Right-No Cyanotic.  Chest and Lung Exam Inspection Accessory muscles - No use of accessory muscles in breathing.  Abdomen Note: Obese but soft. 2 cm mass reduces down to small umbilical hernia. No  guarding. No peritonitis.  Male Genitourinary Note: No hidradenitis. No inguinal hernias. Normal external genitalia. Epididymi, testes, and spermatic cords normal without any masses.  Rectal Note: Left posterior perirectal sinus 2.5 cm from the anal verge. At old scar. I can feel a cord heading to the posterior midline. Suspect internal opening at the anal verge or near a posterior midline distal anal crypt.  Moderate pruritus and moisture. No external hemorrhoids. Normal sphincter tone. No other masses or tumors were concerned. No fissure. No undrained abscess. Grade 1-2 internal hemorrhoids by digital and anoscopic exam. No evidence of any active proctitis nor inflammation.  Peripheral Vascular Upper Extremity Inspection - Left - Not Gangrenous, No Petechiae. Right - Not Gangrenous, No Petechiae.  Neurologic Neurologic evaluation reveals -normal attention span and ability to concentrate, able to name objects and repeat phrases. Appropriate fund of knowledge and normal coordination.  Neuropsychiatric Mental status exam performed with findings of-able to articulate well with normal speech/language, rate, volume and coherence and no evidence of hallucinations, delusions, obsessions or homicidal/suicidal ideation. Orientation-oriented X3.  Musculoskeletal Global Assessment Gait and Station - normal gait and station.  Lymphatic General Lymphatics Description - No Generalized lymphadenopathy.   Results Elijah Hector MD; 09/21/2018 2:21 PM) Procedures  Name Value Date Hemorrhoids Procedure Other: Left posterior perirectal sinus 2.5 cm from the anal verge. At old scar. I can feel a cord heading to the posterior midline. Suspect internal opening at the anal verge or near a posterior midline distal anal crypt............Marland KitchenModerate pruritus and I/moister. No external hemorrhoids. Normal sphincter tone. No other masses or tumors were concerned. No  fissure. No undrained abscess. Grade 1-2 internal hemorrhoids by digital and anoscopic exam. No evidence of any active proctitis nor inflammation.  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 type II intersphincteric suspected on MRI.  Seems more superficial by my exam  Family history of daughter with ulcerative colitis but no strong evidence of inflammatory bowel disease by a negative colonoscopy in 2018 along with no concerning areas on CT enterography. No active proctitis right now.  I think he would benefit from reoperation. Can repeat LIFT repair but failure rate will be increased. Consider fistulotomy if this superficial or does not require major sphincterotomy. Last resort or if 2nd recurrence, proceed with chronic seton placement. Discussed with my other colorectal partner, Dr. Marcello Moores, who agrees.  Another option is ileal & rectal biopsies by colonoscopy to prove or disprove Crohn's disease. If there truly is Crohn's disease, Humira immunosuppression. However, I don't think he has any active proctitis by exam or MRI, so I don't know if he needs anything more aggressive this time. He would like to hold off on further workup.  He is expecting her grandchild in [place] in April like to delay surgery until he returns from that, most likely back in town in May. Not unreasonable to wait a few months as long as nothing else is going on. I did caution would be several weeks before he can return back to work  Current Plans ANOSCOPY, DIAGNOSTIC [phone]) The anatomy & physiology of the anorectal region was discussed. We discussed the pathophysiology of anorectal abscess and fistula. Differential diagnosis was discussed. Natural history progression was discussed. I stressed the importance  of a bowel regimen to have daily soft bowel movements to minimize progression of disease.  The patient's condition is not adequately controlled. Non-operative treatment has not healed the fistula. Therefore, I recommended examination under anaesthesia to confirm the diagnosis and treat the fistula. I discussed techniques that may be required such as fistulotomy, ligation by LIFT technique, and/or seton placement. Benefits & alternatives discussed. I noted a good likelihood this will help address the problem, but sometimes repeat operations and prolonged healing times may occur. Risks such as bleeding, pain, recurrence, reoperation, incontinence, heart attack, death, and other risks were discussed.  Educational handouts further explaining the pathology, treatment options, and bowel regimen were given. The patient expressed understanding & wishes to proceed. We will work to coordinate surgery for a mutually convenient time.  You are being scheduled for surgery- Our schedulers will call you.  You should hear from our office's scheduling department within 5 working days about the location, date, and time of surgery. We try to make accommodations for patient's preferences in scheduling surgery, but sometimes the OR schedule or the surgeon's schedule prevents [place] from making those accommodations.  If you have not heard from our office [phone]) in 5 working days, call the office and ask for your surgeon's nurse.  If you have other questions about your diagnosis, plan, or surgery, call the office and ask for your surgeon's nurse.   ENCOUNTER FOR PREOPERATIVE EXAMINATION FOR GENERAL SURGICAL PROCEDURE (Z01.818)  Current Plans Pt Education - [facility] Rectal Prep for Anorectal outpatient/office surgery: discussed with patient and provided information. Pt Education - [facility] Rectal Surgery HCI ([place] Silveria): discussed with patient and provided information. Pt Education - [facility] Good Bowel Health  ([place] M[place]ready)  UMBILICAL HERNIA WITHOUT OBSTRUCTION AND WITHOUT GANGRENE (K42.9) Impression: Small umbilical hernia. Reducible. Asymptomatic. Would hold off on any intervention, especially in the setting of infection.   BILATERAL INGUINAL HERNIA WITHOUT OBSTRUCTION OR GANGRENE, RECURRENCE NOT SPECIFIED (K40.20) Impression: Incidental inguinal hernias by CT scan and suspected on exam. Very small asymptomatic. Would hold off on any intervention until fistula has healed and remains  so. He is not interested in any surgery there anyway.  Elijah Hector, MD, FACS, MASCRS Gastrointestinal and Minimally Invasive Surgery    [address] [place], [place] [address] [phone] Main / Paging [phone] Fax

## 2018-09-22 ENCOUNTER — Encounter: Payer: Self-pay | Admitting: Nurse Practitioner

## 2018-09-24 ENCOUNTER — Ambulatory Visit: Payer: Self-pay | Admitting: Surgery

## 2018-09-24 NOTE — Progress Notes (Addendum)
[facility]  PATIENT: Elijah Robertson DOB: 1955-10-25   REASON FOR VISIT: Follow-up for CPAP compliance for obstructive sleep apnea HISTORY FROM: Patient    HISTORY OF PRESENT ILLNESS:UPDATE 2/24/2020CM Elijah Robertson, 63 year old male returns for follow-up with history of obstructive sleep apnea here for CPAP compliance.  He has done well.  Compliance data dated 08/24/2018-09/22/2018 shows compliance greater than 4 hours at 100%, average usage 7 hours 39 minutes.  Set pressure 11 cm.  EPR level 3 AHI 1.7.  ESS 6.  He returns for reevaluation.  Patient tells me he is moving back to [place] in May or June with his job.    5/30/19[name] is a 63 year old right-handed gentleman with an underlying medical history of childhood seizures, status post septoplasty, reflux disease and borderline obesity, who presents for follow-up consultation of his obstructive sleep apnea, after sleep testing at home and starting AutoPap therapy. Patient is unaccompanied today. I first met him on 09/10/2017 at the request of his primary care provider, at which time he reported snoring and daytime somnolence. I suggested we proceed with sleep study testing. His insurance denied an attended sleep study. He had a home sleep test on 09/29/2017 which indicated severe sleep apnea based on an AHI of 50.3 per hour, average oxygen saturation of 92%, nadir of 75% with significant time below or at 88% saturation of 38 minutes. Unfortunately, his insurance denied a lab attended sleep study and he was therefore advised to proceed with AutoPap therapy at home.  Today, 01/01/2018: I reviewed his AutoPap compliance data from 12/01/2017 through 12/30/2017 which is a total of 30 days, during which time he used his AutoPap every night with percent used days greater than 4 hours at 90%, indicating excellent compliance with an average usage of 6 hours and 22 minutes, residual AHI borderline at 6.9 per hour, 95th  percentile of pressure at 13.8 cm, leak on the higher end with the 95th percentile at 18.9 L/m on a pressure range of 7 cm to 16 cm with EPR. He reports doing okay, uses a mouth guard with the autoPAP. Wonders if he needs a FFM. He has used a mouth guard for tooth grinding for years. Overall he is quite pleased with how he is doing, he feels better rested, left bathroom breaks at night, sleep more deeper. He is planning a three-week trip to [place] to work and also attend his daughter's wedding. He plans to take his machine with him. He has increase the humidity but sometimes there is condensation in the tubing. He would be willing to try a chinstrap and also try using his CPAP without a mouth guard for a little bit to see if it makes a difference. He uses a med N30i nasal cradle interface.  REVIEW OF SYSTEMS: Full 14 system review of systems performed and notable only for those listed, all others are neg:  Constitutional: neg  Cardiovascular: neg Ear/Nose/Throat: neg  Skin: neg Eyes: neg Respiratory: neg Gastroitestinal: neg  Hematology/Lymphatic: neg  Endocrine: neg Musculoskeletal:neg Allergy/Immunology: neg Neurological: neg Psychiatric: neg Sleep : Obstructive sleep apnea here for CPAP compliance   ALLERGIES: Allergies  Allergen Reactions  . Turmeric     pt believes he is allergic to turmeric with uncomfortable feeling    HOME MEDICATIONS: Outpatient Medications Prior to Visit  Medication Sig Dispense Refill  . acetaminophen (TYLENOL) 650 MG CR tablet Take 1,950 mg by mouth every 8 (eight) hours as needed for pain.    Marland Kitchen aspirin EC 81  MG tablet Take 81 mg by mouth daily.    Marland Kitchen EPINEPHrine 0.3 mg/0.3 mL IJ SOAJ injection Inject 0.3 mLs (0.3 mg total) into the muscle once as needed (anaphylaxis/allergic reaction). 1 Device 0  . sildenafil (REVATIO) 20 MG tablet Take 1-5 tablets by mouth daily as needed for erectile dysfunction. 50 tablet 1  . zinc gluconate 50 MG tablet Take 100  mg by mouth daily.    Marland Kitchen ketoconazole (NIZORAL) 2 % cream Apply 1 application topically daily. (Patient not taking: Reported on 09/28/2018) 15 g 2  . ciprofloxacin (CIPRO) 500 MG tablet Take 1 tablet (500 mg total) by mouth 2 (two) times daily. 28 tablet 0  . metroNIDAZOLE (FLAGYL) 500 MG tablet Take 1 tablet (500 mg total) by mouth 2 (two) times daily. 28 tablet 0   No facility-administered medications prior to visit.     PAST MEDICAL HISTORY: Past Medical History:  Diagnosis Date  . Diverticulosis   . GERD (gastroesophageal reflux disease)   . Hemorrhoids   . Hives   . Perianal fistula   . Seizures ([place])    Childhood - none in adulthood  . Sleep apnea    CPAP    PAST SURGICAL HISTORY: Past Surgical History:  Procedure Laterality Date  . COLONOSCOPY W/ POLYPECTOMY    . NOSE SURGERY     Septal surgery  . RECTAL SURGERY  05/2017   fistula repair    FAMILY HISTORY: Family History  Problem Relation Age of Onset  . Breast cancer Mother   . Healthy Maternal Grandmother   . Healthy Maternal Grandfather   . Healthy Paternal Grandmother   . Colon cancer Neg Hx     SOCIAL HISTORY: Social History   Socioeconomic History  . Marital status: Single    Spouse name: Not on file  . Number of children: 3  . Years of education: 91  . Highest education level: Not on file  Occupational History  . Not on file  Social Needs  . Financial resource strain: Not on file  . Food insecurity:    Worry: Not on file    Inability: Not on file  . Transportation needs:    Medical: Not on file    Non-medical: Not on file  Tobacco Use  . Smoking status: Former Smoker    Packs/day: 1.00    Years: 32.00    Pack years: 32.00    Types: Cigarettes    Last attempt to quit: 08/05/2010    Years since quitting: 8.1  . Smokeless tobacco: Never Used  Substance and Sexual Activity  . Alcohol use: Yes    Alcohol/week: 2.0 - 3.0 standard drinks    Types: 2 - 3 Glasses of wine per week  . Drug use:  Yes    Types: Marijuana    Comment: 1x every couple of months,FEB 2018  . Sexual activity: Yes  Lifestyle  . Physical activity:    Days per week: Not on file    Minutes per session: Not on file  . Stress: Not on file  Relationships  . Social connections:    Talks on phone: Not on file    Gets together: Not on file    Attends religious service: Not on file    Active member of club or organization: Not on file    Attends meetings of clubs or organizations: Not on file    Relationship status: Not on file  . Intimate partner violence:    Fear of current or  ex partner: Not on file    Emotionally abused: Not on file    Physically abused: Not on file    Forced sexual activity: Not on file  Other Topics Concern  . Not on file  Social History Narrative   Fun/Hobby: Engineer, civil (consulting) sports, hang out with friends; Backyard BBQ; Travel     PHYSICAL EXAM  Vitals:   09/28/18 0753  BP: (!) 132/91  Pulse: 71  Weight: 213 lb (96.6 kg)  Height: 5' 10"  (1.778 m)   Body mass index is 30.56 kg/m.  Generalized: Well developed, obese male in no acute distress  Head: normocephalic and atraumatic,. Oropharynx benign  Neck: Supple,  Musculoskeletal: No deformity   Neurological examination   Mentation: Alert oriented to time, place, history taking. Attention span and concentration appropriate. Recent and remote memory intact.  Follows all commands speech and language fluent.   Cranial nerve II-XII: Pupils were equal round reactive to light extraocular movements were full, visual field were full on confrontational test. Facial sensation and strength were normal. hearing was intact to finger rubbing bilaterally. Uvula tongue midline. head turning and shoulder shrug were normal and symmetric.Tongue protrusion into cheek strength was normal. Motor: normal bulk and tone, full strength in the BUE, BLE,  Sensory: normal and symmetric to light touch,  Coordination: finger-nose-finger, heel-to-shin  bilaterally, no dysmetria Gait and Station: Rising up from seated position without assistance, normal stance,  moderate stride, good arm swing, smooth turning, able to perform tiptoe, and heel walking without difficulty. Tandem gait is steady  DIAGNOSTIC DATA (LABS, IMAGING, TESTING) - I reviewed patient records, labs, notes, testing and imaging myself where available.  Lab Results  Component Value Date   WBC 12.5 (H) 02/22/2018   HGB 14.3 02/22/2018   HCT 42.9 02/22/2018   MCV 94.7 02/22/2018   PLT 296 02/22/2018      Component Value Date/Time   NA 142 02/22/2018 0732   K 3.8 02/22/2018 0732   CL 106 02/22/2018 0732   CO2 26 02/22/2018 0732   GLUCOSE 93 02/22/2018 0732   BUN 20 07/08/2018 0727   CREATININE 1.17 07/08/2018 0727   CALCIUM 9.0 02/22/2018 0732   PROT 6.5 02/22/2018 0732   ALBUMIN 3.4 (L) 02/22/2018 0732   AST 19 02/22/2018 0732   ALT 25 02/22/2018 0732   ALKPHOS 53 02/22/2018 0732   BILITOT 0.9 02/22/2018 0732   GFRNONAA 60 (L) 02/22/2018 0732   GFRAA >60 02/22/2018 0732   Lab Results  Component Value Date   CHOL 228 (H) 10/30/2017   HDL 44.80 10/30/2017   LDLCALC 148 (H) 10/30/2017   TRIG 176.0 (H) 10/30/2017   CHOLHDL 5 10/30/2017    ASSESSMENT AND PLAN Daveyon Kitchings a very pleasant 46 year oldmalewith an underlying medical history of childhood seizures, status post septoplasty, reflux disease and borderline obesity, who presents for FU consultation of his obstructive sleep apnea (OSA), which was determined to be in the severe range via home sleep testing on 09/29/17. He has established treatment with autoPAP. He is compliant with his treatment     CPAP compliance 100% reviewed data with patient Continue same settings Please check at the front desk to find out how to get your medical record to take with you due  to move No follow-up planned patient is moving out of state Elijah Bible, [facility], [facility], [facility] [address], [place], [place] [address] [phone]  I reviewed the above note and documentation by  the Nurse Practitioner and agree with the history, physical exam, assessment and plan as outlined above. I was immediately available for face-to-face consultation. Star Age, MD, PhD [facility])

## 2018-09-28 ENCOUNTER — Ambulatory Visit (INDEPENDENT_AMBULATORY_CARE_PROVIDER_SITE_OTHER): Payer: 59 | Admitting: Nurse Practitioner

## 2018-09-28 ENCOUNTER — Encounter: Payer: Self-pay | Admitting: Nurse Practitioner

## 2018-09-28 DIAGNOSIS — G4733 Obstructive sleep apnea (adult) (pediatric): Secondary | ICD-10-CM | POA: Insufficient documentation

## 2018-09-28 DIAGNOSIS — Z9989 Dependence on other enabling machines and devices: Secondary | ICD-10-CM | POA: Diagnosis not present

## 2018-09-28 NOTE — Patient Instructions (Signed)
CPAP compliance 100% Continue same settings No follow-up planned patient is moving out of state

## 2018-10-10 ENCOUNTER — Encounter: Payer: Self-pay | Admitting: Nurse Practitioner

## 2018-10-16 ENCOUNTER — Encounter: Payer: Self-pay | Admitting: Family Medicine

## 2018-11-06 ENCOUNTER — Ambulatory Visit (INDEPENDENT_AMBULATORY_CARE_PROVIDER_SITE_OTHER): Payer: 59 | Admitting: Family Medicine

## 2018-11-06 ENCOUNTER — Encounter: Payer: 59 | Admitting: Nurse Practitioner

## 2018-11-06 ENCOUNTER — Other Ambulatory Visit: Payer: Self-pay

## 2018-11-06 ENCOUNTER — Encounter: Payer: Self-pay | Admitting: Family Medicine

## 2018-11-06 DIAGNOSIS — J3489 Other specified disorders of nose and nasal sinuses: Secondary | ICD-10-CM

## 2018-11-06 DIAGNOSIS — Z9989 Dependence on other enabling machines and devices: Secondary | ICD-10-CM

## 2018-11-06 DIAGNOSIS — G4733 Obstructive sleep apnea (adult) (pediatric): Secondary | ICD-10-CM | POA: Diagnosis not present

## 2018-11-06 DIAGNOSIS — Z8639 Personal history of other endocrine, nutritional and metabolic disease: Secondary | ICD-10-CM

## 2018-11-06 DIAGNOSIS — L309 Dermatitis, unspecified: Secondary | ICD-10-CM

## 2018-11-06 NOTE — Progress Notes (Signed)
Virtual Visit via Video Note  Video on pt's end was not working, this provider only able to hear pt.  Pt was able to see and hear this provider. I connected with Lorenza Chick on 11/06/18 at  8:00 AM EDT by a video enabled telemedicine application and verified that I am speaking with the correct person using two identifiers.  Location patient: home Location provider:home office Persons participating in the virtual visit: patient, provider  I discussed the limitations of evaluation and management by telemedicine and the availability of in person appointments. The patient expressed understanding and agreed to proceed.   HPI: Pt is a 63 yo male with pmh sig for OSA, GERD  who presents for TOC, previously seen by Caesar Chestnut, NP.  Pt moved from [place] a few yrs ago, states his skin "hates it".  Getting eczema on hands.  Also notes irritation in skin folds of upper thigh after exercising.  Had allergy testing.  Advised mild sensitivity to mold.  2nd Fistula repair surgery scheduled for April 30th.  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   Was over wieght at last CPE.  Lost 18 lbs with exercise 5x/wk.  States having a hard time since the gym is closed.  Walking now for exercise.  May have 32 oz of water per day.  Was on 3 OTC allergy meds.  States they made his bp elevated.  Not currently taking the meds.  Pt is not checking his bp.  Pt is not convinced he needs bp meds and states he has no plans to take any.  OSA on CPAP.  Wears nightly.  States takes his machine everywhere he goes.  GERD is eats something spicy.  Not taking anything.  Has improved over the last year.  Perforated nasal septum:  Occurred after a prolonged nose bleed caused infection of septum.  Pt required surgery. Advised to stop smoking to aid healing.  Uses nasal saline rinse BID.  In the past got frequent nose bleeds, but notes less since using CPAP.  ROS: See pertinent positives and negatives per  HPI.  Past Medical History:  Diagnosis Date  . Diverticulosis   . GERD (gastroesophageal reflux disease)   . Hemorrhoids   . Hives   . Perianal fistula   . Seizures ([place])    Childhood - none in adulthood  . Sleep apnea    CPAP    Past Surgical History:  Procedure Laterality Date  . COLONOSCOPY W/ POLYPECTOMY    . NOSE SURGERY     Septal surgery  . RECTAL SURGERY  05/2017   fistula repair    Family History  Problem Relation Age of Onset  . Breast cancer Mother   . Healthy Maternal Grandmother   . Healthy Maternal Grandfather   . Healthy Paternal Grandmother   . Colon cancer Neg Hx     SOCIAL HX: Pt grew up in [place].  Pt moved from [place], [place] to [employer] for his job.  Pt works in Engineer, technical sales.  States he was given the option of being laid off or moving to [place] to help with a new company.  Pt quit smoking 8 yrs ago.  Was in [place] for [place], when his nose started bleeding and wouldn't stop.  Pt's nasal septum became infected requiring surgery.  Pt was advised to stop smoking to help with healing.  Has a perforated septum as a result.  Pt was going out of town, but cancelled  his flight given the issues with COVID-19.  Pt was going to visit his eldest daughter who is expecting in the next week or so.  Pt's middle daughter is on Humira, so she does not need to be exposed to the virus.   Current Outpatient Medications:  .  acetaminophen (TYLENOL) 650 MG CR tablet, Take 1,950 mg by mouth every 8 (eight) hours as needed for pain., Disp: , Rfl:  .  aspirin EC 81 MG tablet, Take 81 mg by mouth daily., Disp: , Rfl:  .  EPINEPHrine 0.3 mg/0.3 mL IJ SOAJ injection, Inject 0.3 mLs (0.3 mg total) into the muscle once as needed (anaphylaxis/allergic reaction)., Disp: 1 Device, Rfl: 0 .  ketoconazole (NIZORAL) 2 % cream, Apply 1 application topically daily. (Patient not taking: Reported on 09/28/2018), Disp: 15 g, Rfl: 2 .  sildenafil (REVATIO) 20 MG tablet, Take 1-5 tablets by mouth daily  as needed for erectile dysfunction., Disp: 50 tablet, Rfl: 1 .  zinc gluconate 50 MG tablet, Take 100 mg by mouth daily., Disp: , Rfl:   EXAM:  VITALS per patient if applicable:  RR between 12-20 bpm  GENERAL: pt sounds pleasant, alert, oriented, and in no acute distress  LUNGS: Able to carry on a conversation without being SOB, breathing rate appears normal, no obvious gasping or wheezing  PSYCH/NEURO: pleasant and cooperative, no obvious depression or anxiety, speech and thought processing grossly intact  ASSESSMENT AND PLAN:  Discussed the following assessment and plan:  OSA on CPAP -pt encouraged to continue regular use of CPAP -advised CPAP use will help with elevated bp  Eczema, unspecified type -discussed using a good moisturizer such as Cetaphil, Eucerin, Cerave, etc. -discussed taking warm, not hot showers -can also use hydrocortisone cream BID for 7 days -pt has seen derm in the past.  Advised can f/u with them for further recommendations.   -As unable to see rash no rx given.  History of obesity -congratulated on weight loss -pt encouraged to continue exercising by walking. -pt advised to check out free online classes being offered given COVID-19 -discussed making healthy food choices  Nasal septal perforation -continue BID saline irrigation   Will plan for CPE in the next month or so depending on COVID-19.  Pt to check back with clinic to schedule this.  I discussed the assessment and treatment plan with the patient. The patient was provided an opportunity to ask questions and all were answered. The patient agreed with the plan and demonstrated an understanding of the instructions.   The patient was advised to call back or seek an in-person evaluation if the symptoms worsen or if the condition fails to improve as anticipated.  More than 50% of over 20 minutes spent in total with the patient, counseling and/or coordinating care.    Billie Ruddy, MD

## 2018-12-14 ENCOUNTER — Ambulatory Visit: Payer: Self-pay | Admitting: Surgery

## 2019-01-14 NOTE — Patient Instructions (Addendum)
YOU ARE REQUIRED TO BE TESTED FOR COVID-19 PRIOR TO YOUR SURGERY . YOUR TEST MUST BE COMPLETED ON Saturday, June 13TH. TESTING IS LOCATED AT [place] ENTRANCE FROM 9:00AM - 12:00PM. FAILURE TO COMPLETE TESTING MAY RESULT IN CANCELLATION OF YOUR SURGERY.                Abou Sterkel    Your procedure is scheduled on: 01-20-2019   Report to [facility] Main  Entrance    Report to admitting at 900 AM      BRING CPAP MASK AND TUBING   Call this number if you have problems the morning of surgery [phone]    Remember: [place]. Do not eat food or drink liquids After Midnight.   BRUSH YOUR TEETH MORNING OF SURGERY AND RINSE YOUR MOUTH OUT, NO CHEWING GUM CANDY OR MINTS.     Take these medicines the morning of surgery with A SIP OF WATER: NONE                               You may not have any metal on your body including hair pins and              piercings  Do not wear jewelry, make-up, lotions, powders or perfumes, deodorant             Do not wear nail polish.  Do not shave  48 hours prior to surgery.              Men may shave face and neck.   Do not bring valuables to the hospital. [place].  Contacts, dentures or bridgework may not be worn into surgery.  Leave suitcase in the car. After surgery it may be brought to your room.     Patients discharged the day of surgery will not be allowed to drive home. IF YOU ARE HAVING SURGERY AND GOING HOME THE SAME DAY, YOU MUST HAVE AN ADULT TO DRIVE YOU HOME AND BE WITH YOU FOR 24 HOURS. YOU MAY GO HOME BY TAXI OR UBER OR ORTHERWISE, BUT AN ADULT MUST ACCOMPANY YOU HOME AND STAY WITH YOU FOR 24 HOURS.  Name and phone number of your driver:  Special Instructions: N/A              Please read over the following fact sheets you were  given: _____________________________________________________________________             [facility] - Preparing for Surgery Before surgery, you can play an important role.  Because skin is not sterile, your skin needs to be as free of germs as possible.  You can reduce the number of germs on your skin by washing with CHG (chlorahexidine gluconate) soap before surgery.  CHG is an antiseptic cleaner which kills germs and bonds with the skin to continue killing germs even after washing. Please DO NOT use if you have an allergy to CHG or antibacterial soaps.  If your skin becomes reddened/irritated stop using the CHG and inform your nurse when you arrive at Short Stay. Do not shave (including legs and underarms) for at least 48 hours prior to the first CHG shower.  You may shave your face/neck. Please follow these instructions carefully:  1.  Shower with CHG Soap the night before surgery and the  morning of Surgery.  2.  If you choose to wash your hair, wash your hair first as usual with your  normal  shampoo.  3.  After you shampoo, rinse your hair and body thoroughly to remove the  shampoo.                           4.  Use CHG as you would any other liquid soap.  You can apply chg directly  to the skin and wash                       Gently with a scrungie or clean washcloth.  5.  Apply the CHG Soap to your body ONLY FROM THE NECK DOWN.   Do not use on face/ open                           Wound or open sores. Avoid contact with eyes, ears mouth and genitals (private parts).                       Wash face,  Genitals (private parts) with your normal soap.             6.  Wash thoroughly, paying special attention to the area where your surgery  will be performed.  7.  Thoroughly rinse your body with warm water from the neck down.  8.  DO NOT shower/wash with your normal soap after using and rinsing off  the CHG Soap.                9.  Pat yourself dry with a clean towel.            10.  Wear  clean pajamas.            11.  Place clean sheets on your bed the night of your first shower and do not  sleep with pets. Day of Surgery : Do not apply any lotions/deodorants the morning of surgery.  Please wear clean clothes to the hospital/surgery center.  FAILURE TO FOLLOW THESE INSTRUCTIONS MAY RESULT IN THE CANCELLATION OF YOUR SURGERY PATIENT SIGNATURE_________________________________  NURSE SIGNATURE__________________________________  ________________________________________________________________________

## 2019-01-15 ENCOUNTER — Encounter (HOSPITAL_COMMUNITY)
Admission: RE | Admit: 2019-01-15 | Discharge: 2019-01-15 | Disposition: A | Discharge status: Home / Self Care | Payer: 59 | Source: Ambulatory Visit | Attending: Surgery | Admitting: Surgery

## 2019-01-15 ENCOUNTER — Encounter (HOSPITAL_COMMUNITY): Payer: Self-pay

## 2019-01-15 ENCOUNTER — Other Ambulatory Visit: Payer: Self-pay

## 2019-01-15 DIAGNOSIS — Z01812 Encounter for preprocedural laboratory examination: Secondary | ICD-10-CM | POA: Diagnosis present

## 2019-01-15 DIAGNOSIS — Z1159 Encounter for screening for other viral diseases: Secondary | ICD-10-CM | POA: Insufficient documentation

## 2019-01-15 DIAGNOSIS — K603 Anal fistula: Secondary | ICD-10-CM | POA: Insufficient documentation

## 2019-01-15 HISTORY — DX: Anal fistula: K60.3

## 2019-01-15 HISTORY — DX: Anal fistula, unspecified: K60.30

## 2019-01-15 LAB — CBC
HCT: 45.1 % (ref 39.0–52.0)
Hemoglobin: 15.3 g/dL (ref 13.0–17.0)
MCH: 32.2 pg (ref 26.0–34.0)
MCHC: 33.9 g/dL (ref 30.0–36.0)
MCV: 94.9 fL (ref 80.0–100.0)
Platelets: 227 10*3/uL (ref 150–400)
RBC: 4.75 MIL/uL (ref 4.22–5.81)
RDW: 12.6 % (ref 11.5–15.5)
WBC: 6.1 10*3/uL (ref 4.0–10.5)
nRBC: 0 % (ref 0.0–0.2)

## 2019-01-15 LAB — BASIC METABOLIC PANEL
Anion gap: 6 (ref 5–15)
BUN: 16 mg/dL (ref 6–20)
CO2: 26 mmol/L (ref 22–32)
Calcium: 9.3 mg/dL (ref 8.9–10.3)
Chloride: 105 mmol/L (ref 98–111)
Creatinine, Ser: 1.05 mg/dL (ref 0.61–1.24)
GFR calc Af Amer: >60 mL/min (ref >60)
GFR calc non Af Amer: >60 mL/min (ref >60)
Glucose, Bld: 93 mg/dL (ref 70–99)
Potassium: 4.5 mmol/L (ref 3.5–5.1)
Sodium: 137 mmol/L (ref 135–145)

## 2019-01-16 ENCOUNTER — Other Ambulatory Visit (HOSPITAL_COMMUNITY)
Admission: RE | Admit: 2019-01-16 | Discharge: 2019-01-16 | Disposition: A | Discharge status: Home / Self Care | Payer: 59 | Source: Ambulatory Visit | Attending: Surgery | Admitting: Surgery

## 2019-01-16 DIAGNOSIS — Z01812 Encounter for preprocedural laboratory examination: Secondary | ICD-10-CM | POA: Diagnosis not present

## 2019-01-18 LAB — NOVEL CORONAVIRUS, NAA (HOSP ORDER, SEND-OUT TO REF LAB; TAT 18-24 HRS): SARS-CoV-2, NAA: NOT DETECTED

## 2019-01-19 MED ORDER — BUPIVACAINE LIPOSOME 1.3 % IJ SUSP
20.0000 mL | Freq: Once | INTRAMUSCULAR | Status: DC
  Filled 2019-01-19: qty 20

## 2019-01-19 NOTE — Progress Notes (Signed)
SPOKE W/  _ Elijah Robertson (pt states has quarantined since having covid 19 test preformed)     SCREENING SYMPTOMS OF COVID 19:   COUGH--NO  RUNNY NOSE---NO  SORE THROAT---NO  NASAL CONGESTION----NO  SNEEZING----NO  SHORTNESS OF BREATH---NO  DIFFICULTY BREATHING---NO  TEMP >100.0 -----NO  UNEXPLAINED BODY ACHES------NO  CHILLS --------NO   HEADACHES ---------NO  LOSS OF SMELL/ TASTE --------NO    HAVE YOU OR ANY FAMILY MEMBER TRAVELLED PAST 14 DAYS OUT OF THE   COUNTY---NO STATE----NO COUNTRY----NO  HAVE YOU OR ANY FAMILY MEMBER BEEN EXPOSED TO ANYONE WITH COVID 19? NO

## 2019-01-20 ENCOUNTER — Encounter (HOSPITAL_COMMUNITY)
Admission: RE | Disposition: A | Discharge status: Home / Self Care | Payer: Self-pay | Source: Home / Self Care | Attending: Surgery

## 2019-01-20 ENCOUNTER — Ambulatory Visit (HOSPITAL_COMMUNITY): Payer: 59 | Admitting: Anesthesiology

## 2019-01-20 ENCOUNTER — Ambulatory Visit (HOSPITAL_COMMUNITY): Payer: 59 | Admitting: Physician Assistant

## 2019-01-20 ENCOUNTER — Encounter (HOSPITAL_COMMUNITY): Payer: Self-pay | Admitting: *Deleted

## 2019-01-20 ENCOUNTER — Ambulatory Visit (HOSPITAL_COMMUNITY)
Admission: RE | Admit: 2019-01-20 | Discharge: 2019-01-20 | Disposition: A | Discharge status: Home / Self Care | Payer: 59 | Attending: Surgery | Admitting: Surgery

## 2019-01-20 DIAGNOSIS — Z8249 Family history of ischemic heart disease and other diseases of the circulatory system: Secondary | ICD-10-CM | POA: Diagnosis not present

## 2019-01-20 DIAGNOSIS — Z87891 Personal history of nicotine dependence: Secondary | ICD-10-CM | POA: Diagnosis not present

## 2019-01-20 DIAGNOSIS — K429 Umbilical hernia without obstruction or gangrene: Secondary | ICD-10-CM | POA: Diagnosis not present

## 2019-01-20 DIAGNOSIS — K4020 Bilateral inguinal hernia, without obstruction or gangrene, not specified as recurrent: Secondary | ICD-10-CM | POA: Insufficient documentation

## 2019-01-20 DIAGNOSIS — M549 Dorsalgia, unspecified: Secondary | ICD-10-CM | POA: Diagnosis not present

## 2019-01-20 DIAGNOSIS — Z86010 Personal history of colonic polyps: Secondary | ICD-10-CM | POA: Diagnosis not present

## 2019-01-20 DIAGNOSIS — K603 Anal fistula: Secondary | ICD-10-CM

## 2019-01-20 DIAGNOSIS — G4730 Sleep apnea, unspecified: Secondary | ICD-10-CM | POA: Insufficient documentation

## 2019-01-20 DIAGNOSIS — K604 Rectal fistula: Secondary | ICD-10-CM | POA: Diagnosis not present

## 2019-01-20 DIAGNOSIS — G40909 Epilepsy, unspecified, not intractable, without status epilepticus: Secondary | ICD-10-CM | POA: Insufficient documentation

## 2019-01-20 DIAGNOSIS — Z803 Family history of malignant neoplasm of breast: Secondary | ICD-10-CM | POA: Diagnosis not present

## 2019-01-20 DIAGNOSIS — Z79899 Other long term (current) drug therapy: Secondary | ICD-10-CM | POA: Diagnosis not present

## 2019-01-20 DIAGNOSIS — K219 Gastro-esophageal reflux disease without esophagitis: Secondary | ICD-10-CM | POA: Insufficient documentation

## 2019-01-20 HISTORY — PX: SPHINCTEROTOMY: SHX5279

## 2019-01-20 HISTORY — PX: EVALUATION UNDER ANESTHESIA WITH HEMORRHOIDECTOMY: SHX5624

## 2019-01-20 SURGERY — EXAM UNDER ANESTHESIA WITH HEMORRHOIDECTOMY
Anesthesia: General | Site: Rectum

## 2019-01-20 MED ORDER — MIDAZOLAM HCL 2 MG/2ML IJ SOLN
INTRAMUSCULAR | Status: AC
  Filled 2019-01-20: qty 2

## 2019-01-20 MED ORDER — PROPOFOL 10 MG/ML IV BOLUS
INTRAVENOUS | Status: DC | PRN
  Administered 2019-01-20: 150 mg via INTRAVENOUS

## 2019-01-20 MED ORDER — PROPOFOL 10 MG/ML IV BOLUS
INTRAVENOUS | Status: AC
  Filled 2019-01-20: qty 20

## 2019-01-20 MED ORDER — ONDANSETRON HCL 4 MG/2ML IJ SOLN
INTRAMUSCULAR | Status: DC | PRN
  Administered 2019-01-20: 4 mg via INTRAVENOUS

## 2019-01-20 MED ORDER — SUGAMMADEX SODIUM 200 MG/2ML IV SOLN
INTRAVENOUS | Status: DC | PRN
  Administered 2019-01-20: 200 mg via INTRAVENOUS

## 2019-01-20 MED ORDER — GABAPENTIN 300 MG PO CAPS
300.0000 mg | ORAL_CAPSULE | ORAL | Status: AC
  Administered 2019-01-20: 300 mg via ORAL
  Filled 2019-01-20: qty 1

## 2019-01-20 MED ORDER — MIDAZOLAM HCL 5 MG/5ML IJ SOLN
INTRAMUSCULAR | Status: DC | PRN
  Administered 2019-01-20: 2 mg via INTRAVENOUS

## 2019-01-20 MED ORDER — BUPIVACAINE-EPINEPHRINE 0.25% -1:200000 IJ SOLN
INTRAMUSCULAR | Status: DC | PRN
  Administered 2019-01-20: 20 mL

## 2019-01-20 MED ORDER — BUPIVACAINE LIPOSOME 1.3 % IJ SUSP
INTRAMUSCULAR | Status: DC | PRN
  Administered 2019-01-20: 20 mL

## 2019-01-20 MED ORDER — METHYLENE BLUE 1 % INJ SOLN
INTRAMUSCULAR | Status: DC | PRN
  Administered 2019-01-20: 5 mL

## 2019-01-20 MED ORDER — LIDOCAINE 2% (20 MG/ML) 5 ML SYRINGE
INTRAMUSCULAR | Status: AC
  Filled 2019-01-20: qty 5

## 2019-01-20 MED ORDER — SUCCINYLCHOLINE CHLORIDE 200 MG/10ML IV SOSY
PREFILLED_SYRINGE | INTRAVENOUS | Status: DC | PRN
  Administered 2019-01-20: 100 mg via INTRAVENOUS

## 2019-01-20 MED ORDER — HYDROMORPHONE HCL 1 MG/ML IJ SOLN: 0.2500 mg | INTRAMUSCULAR | Status: DC | PRN

## 2019-01-20 MED ORDER — METRONIDAZOLE IN NACL 5-0.79 MG/ML-% IV SOLN
500.0000 mg | INTRAVENOUS | Status: AC
  Administered 2019-01-20: 500 mg via INTRAVENOUS
  Filled 2019-01-20: qty 100

## 2019-01-20 MED ORDER — DEXAMETHASONE SODIUM PHOSPHATE 10 MG/ML IJ SOLN
INTRAMUSCULAR | Status: DC | PRN
  Administered 2019-01-20: 10 mg via INTRAVENOUS

## 2019-01-20 MED ORDER — CHLORHEXIDINE GLUCONATE CLOTH 2 % EX PADS: 6.0000 | MEDICATED_PAD | Freq: Once | CUTANEOUS | Status: DC

## 2019-01-20 MED ORDER — LIDOCAINE 2% (20 MG/ML) 5 ML SYRINGE
INTRAMUSCULAR | Status: DC | PRN
  Administered 2019-01-20: 1.5 mg/kg/h via INTRAVENOUS

## 2019-01-20 MED ORDER — ONDANSETRON HCL 4 MG/2ML IJ SOLN
INTRAMUSCULAR | Status: AC
  Filled 2019-01-20: qty 2

## 2019-01-20 MED ORDER — SUCCINYLCHOLINE CHLORIDE 200 MG/10ML IV SOSY
PREFILLED_SYRINGE | INTRAVENOUS | Status: AC
  Filled 2019-01-20: qty 10

## 2019-01-20 MED ORDER — FENTANYL CITRATE (PF) 100 MCG/2ML IJ SOLN
INTRAMUSCULAR | Status: DC | PRN
  Administered 2019-01-20: 50 ug via INTRAVENOUS
  Administered 2019-01-20: 100 ug via INTRAVENOUS

## 2019-01-20 MED ORDER — ROCURONIUM BROMIDE 50 MG/5ML IV SOSY
PREFILLED_SYRINGE | INTRAVENOUS | Status: DC | PRN
  Administered 2019-01-20: 40 mg via INTRAVENOUS

## 2019-01-20 MED ORDER — ACETAMINOPHEN 500 MG PO TABS
1000.0000 mg | ORAL_TABLET | ORAL | Status: AC
  Administered 2019-01-20: 1000 mg via ORAL
  Filled 2019-01-20: qty 2

## 2019-01-20 MED ORDER — OXYCODONE HCL 5 MG PO TABS: 5.0000 mg | ORAL_TABLET | Freq: Four times a day (QID) | ORAL | 0 refills | Status: AC | PRN

## 2019-01-20 MED ORDER — LACTATED RINGERS IV SOLN
INTRAVENOUS | Status: DC
  Administered 2019-01-20: 10:00:00 via INTRAVENOUS

## 2019-01-20 MED ORDER — DIBUCAINE (PERIANAL) 1 % EX OINT
TOPICAL_OINTMENT | CUTANEOUS | Status: DC | PRN
  Administered 2019-01-20: 1 via RECTAL

## 2019-01-20 MED ORDER — LIDOCAINE 2% (20 MG/ML) 5 ML SYRINGE
INTRAMUSCULAR | Status: DC | PRN
  Administered 2019-01-20: 60 mg via INTRAVENOUS

## 2019-01-20 MED ORDER — SODIUM CHLORIDE 0.9 % IV SOLN
2.0000 g | INTRAVENOUS | Status: AC
  Administered 2019-01-20: 12:00:00 2 g via INTRAVENOUS
  Filled 2019-01-20: qty 20

## 2019-01-20 MED ORDER — ROCURONIUM BROMIDE 10 MG/ML (PF) SYRINGE
PREFILLED_SYRINGE | INTRAVENOUS | Status: AC
  Filled 2019-01-20: qty 10

## 2019-01-20 MED ORDER — METHYLENE BLUE 0.5 % INJ SOLN
INTRAVENOUS | Status: AC
  Filled 2019-01-20: qty 10

## 2019-01-20 MED ORDER — BUPIVACAINE-EPINEPHRINE (PF) 0.25% -1:200000 IJ SOLN
INTRAMUSCULAR | Status: AC
  Filled 2019-01-20: qty 60

## 2019-01-20 MED ORDER — SUGAMMADEX SODIUM 200 MG/2ML IV SOLN
INTRAVENOUS | Status: AC
  Filled 2019-01-20: qty 2

## 2019-01-20 MED ORDER — CELECOXIB 200 MG PO CAPS
200.0000 mg | ORAL_CAPSULE | ORAL | Status: AC
  Administered 2019-01-20: 200 mg via ORAL
  Filled 2019-01-20: qty 1

## 2019-01-20 MED ORDER — DEXAMETHASONE SODIUM PHOSPHATE 10 MG/ML IJ SOLN
INTRAMUSCULAR | Status: AC
  Filled 2019-01-20: qty 1

## 2019-01-20 MED ORDER — FENTANYL CITRATE (PF) 250 MCG/5ML IJ SOLN
INTRAMUSCULAR | Status: AC
  Filled 2019-01-20: qty 5

## 2019-01-20 MED ORDER — DIBUCAINE (PERIANAL) 1 % EX OINT
TOPICAL_OINTMENT | CUTANEOUS | Status: AC
  Filled 2019-01-20: qty 28

## 2019-01-20 SURGICAL SUPPLY — 35 items
BENZOIN TINCTURE PRP APPL 2/3 (GAUZE/BANDAGES/DRESSINGS) ×3 IMPLANT
BLADE SURG 15 STRL LF DISP TIS (BLADE) ×2 IMPLANT
BLADE SURG 15 STRL SS (BLADE) ×1
BRIEF STRETCH FOR OB PAD LRG (UNDERPADS AND DIAPERS) ×3 IMPLANT
COVER SURGICAL LIGHT HANDLE (MISCELLANEOUS) ×3 IMPLANT
DRAPE LAPAROTOMY T 102X78X121 (DRAPES) ×3 IMPLANT
DRSG PAD ABDOMINAL 8X10 ST (GAUZE/BANDAGES/DRESSINGS) ×3 IMPLANT
ELECT PENCIL ROCKER SW 15FT (MISCELLANEOUS) ×3 IMPLANT
ELECT REM PT RETURN 15FT ADLT (MISCELLANEOUS) ×3 IMPLANT
GAUZE 4X4 16PLY RFD (DISPOSABLE) ×3 IMPLANT
GAUZE SPONGE 4X4 12PLY STRL (GAUZE/BANDAGES/DRESSINGS) ×3 IMPLANT
GLOVE ECLIPSE 8.0 STRL XLNG CF (GLOVE) ×3 IMPLANT
GLOVE INDICATOR 8.0 STRL GRN (GLOVE) ×3 IMPLANT
GOWN STRL REUS W/TWL XL LVL3 (GOWN DISPOSABLE) ×9 IMPLANT
KIT BASIN OR (CUSTOM PROCEDURE TRAY) ×3 IMPLANT
KIT TURNOVER KIT A (KITS) ×3 IMPLANT
LOOP VESSEL MAXI BLUE (MISCELLANEOUS) IMPLANT
NEEDLE HYPO 22GX1.5 SAFETY (NEEDLE) ×3 IMPLANT
PACK BASIC VI WITH GOWN DISP (CUSTOM PROCEDURE TRAY) ×3 IMPLANT
SHEARS HARMONIC 9CM CVD (BLADE) IMPLANT
SUCTION FRAZIER HANDLE 12FR (TUBING)
SUCTION TUBE FRAZIER 12FR DISP (TUBING) IMPLANT
SURGILUBE 2OZ TUBE FLIPTOP (MISCELLANEOUS) ×3 IMPLANT
SUT CHROMIC 2 0 SH (SUTURE) IMPLANT
SUT CHROMIC 3 0 SH 27 (SUTURE) ×3 IMPLANT
SUT VIC AB 2-0 SH 27 (SUTURE)
SUT VIC AB 2-0 SH 27X BRD (SUTURE) IMPLANT
SUT VIC AB 2-0 UR6 27 (SUTURE) ×18 IMPLANT
SWAB CULTURE ESWAB REG 1ML (MISCELLANEOUS) IMPLANT
SYR 20CC LL (SYRINGE) ×3 IMPLANT
SYR 3ML LL SCALE MARK (SYRINGE) IMPLANT
SYR BULB IRRIGATION 50ML (SYRINGE) IMPLANT
TOWEL OR 17X26 10 PK STRL BLUE (TOWEL DISPOSABLE) ×3 IMPLANT
TOWEL OR NON WOVEN STRL DISP B (DISPOSABLE) ×3 IMPLANT
YANKAUER SUCT BULB TIP 10FT TU (MISCELLANEOUS) ×3 IMPLANT

## 2019-01-20 NOTE — Transfer of Care (Signed)
Immediate Anesthesia Transfer of Care Note  Patient: Elijah Robertson  Procedure(s) Performed: Tawana Scale UNDER ANESTHESIA (N/A Rectum) INTERNAL FISTULOTOMY WITH SPHINCTEROTOMY  (N/A Rectum)  Patient Location: PACU  Anesthesia Type:General  Level of Consciousness: awake, alert  and oriented  Airway & Oxygen Therapy: Patient Spontanous Breathing and Patient connected to face mask oxygen  Post-op Assessment: Report given to RN and Post -op Vital signs reviewed and stable  Post vital signs: Reviewed and stable  Last Vitals:  Vitals Value Taken Time  BP 146/87 01/20/19 1218  Temp    Pulse 84 01/20/19 1220  Resp 12 01/20/19 1220  SpO2 100 % 01/20/19 1220  Vitals shown include unvalidated device data.  Last Pain:  Vitals:   01/20/19 0910  TempSrc: Oral         Complications: No apparent anesthesia complications

## 2019-01-20 NOTE — Anesthesia Procedure Notes (Signed)
Procedure Name: Intubation Date/Time: 01/20/2019 11:10 AM Performed by: Maxwell Caul, CRNA Pre-anesthesia Checklist: Patient identified, Emergency Drugs available, Suction available and Patient being monitored Patient Re-evaluated:Patient Re-evaluated prior to induction Oxygen Delivery Method: Circle system utilized Preoxygenation: Pre-oxygenation with 100% oxygen Induction Type: IV induction Laryngoscope Size: Mac and 4 Grade View: Grade III Tube type: Oral Number of attempts: 1 Airway Equipment and Method: Stylet Placement Confirmation: ETT inserted through vocal cords under direct vision,  positive ETCO2 and breath sounds checked- equal and bilateral Secured at: 22 cm Tube secured with: Tape Dental Injury: Teeth and Oropharynx as per pre-operative assessment

## 2019-01-20 NOTE — Discharge Instructions (Signed)
ANORECTAL SURGERY:  °POST OPERATIVE INSTRUCTIONS ° °###################################################################### ° °EAT °Start with a pureed / full liquid diet °After 24 hours, gradually transition to a high fiber diet.   ° °CONTROL PAIN °Control pain so you can tolerate bowel movements,  °walk, sleep, tolerate sneezing/coughing, and go up/down stairs. ° ° °HAVE A BOWEL MOVEMENT DAILY °Keep your bowels regular to avoid problems.   °Taking a fiber supplement every day to keep bowels soft.   °Try a laxative to override constipation. °Use an antidairrheal to slow down diarrhea.   °Call if not better after 2 tries ° °WALK °Walk an hour a day.  Control your pain to do that. °  °CALL IF YOU HAVE PROBLEMS/CONCERNS °Call if you are still struggling despite following these instructions. °Call if you have concerns not answered by these instructions ° °###################################################################### ° ° ° °1. Take your usually prescribed home medications unless otherwise directed. °2. DIET: Follow a light bland diet the first 24 hours after arrival home, such as soup, liquids, crackers, etc.  Be sure to include lots of fluids daily.  Avoid fast food or heavy meals as your are more likely to get nauseated.  Eat a low fat the next few days after surgery.   °3. PAIN CONTROL: °a. Pain is best controlled by a usual combination of three different methods TOGETHER: °i. Ice/Heat °ii. Over the counter pain medication °iii. Prescription pain medication °b. Expect swelling and discomfort in the anus/rectal area.  Warm water baths (30-60 minutes up to 6 times a day, especially after bowel meovements) will help. Use ice for the first few days to help decrease swelling and bruising, then switch to heat such as warm towels, sitz baths, warm baths, etc to help relax tight/sore spots and speed recovery.  Some people prefer to use ice alone, heat alone, alternating between ice & heat.  Experiment to what works  for you.   °c. It is helpful to take an over-the-counter pain medication continuously for the first few weeks.  Choose one of the following that works best for you: °i. Naproxen (Aleve, etc)  Two 220mg tabs twice a day °ii. Ibuprofen (Advil, etc) Three 200mg tabs four times a day (every meal & bedtime) °iii. Acetaminophen (Tylenol, etc) 500-650mg four times a day (every meal & bedtime) °d. A  prescription for pain medication (such as oxycodone, hydrocodone, etc) should be given to you upon discharge.  Take your pain medication as prescribed.  °i. If you are having problems/concerns with the prescription medicine (does not control pain, nausea, vomiting, rash, itching, etc), please call us [phone] to see if we need to switch you to a different pain medicine that will work better for you and/or control your side effect better. °ii. If you need a refill on your pain medication, please contact your pharmacy.  They will contact our office to request authorization. Prescriptions will not be filled after 5 pm or on week-ends.  If can take up to 48 hours for it to be filled & ready so avoid waiting until you are down to thel ast pill. °e. A topical cream (Dibucaine) or a prescription for a cream (such as diltiazem 2% gel) may be given to you.  Many people find relief with topical creams.  Some people find it burns too much.  Experiment.  If it helps, use it.  If it burns, don't using it. ° °Use a Sitz Bath 4-8 times a day for relief ° ° °Sitz Bath °A sitz bath   is a warm water bath taken in the sitting position that covers only the hips and buttocks. It may be used for either healing or hygiene purposes. Sitz baths are also used to relieve pain, itching, or muscle spasms. The water may contain medicine. Moist heat will help you heal and relax.  °HOME CARE INSTRUCTIONS  °Take 3 to 4 sitz baths a day. °1. Fill the bathtub half full with warm water. °2. Sit in the water and open the drain a little. °3. Turn on the warm  water to keep the tub half full. Keep the water running constantly. °4. Soak in the water for 15 to 20 minutes. °5. After the sitz bath, pat the affected area dry first. ° ° °4. KEEP YOUR BOWELS REGULAR °a. The goal is one soft bowel movement a day °b. Avoid getting constipated.  Between the surgery and the pain medications, it is common to experience some constipation.  Increasing fluid intake and taking a fiber supplement (such as Metamucil, Citrucel, FiberCon, MiraLax, etc) 2-3 times a day regularly will usually help prevent this problem from occurring.  A mild laxative (prune juice, Milk of Magnesia, MiraLax, etc) should be taken according to package directions if there are no bowel movements after 48 hours. °c. Watch out for diarrhea.  If you have many loose bowel movements, simplify your diet to bland foods & liquids for a few days.  Stop any stool softeners and decrease your fiber supplement.  Switching to mild anti-diarrheal medications (Kayopectate, Pepto Bismol) can help.  Can try an imodium/loperamide dose.  If this worsens or does not improve, please call us. ° °5. Wound Care ° °a. Remove your bandages with your first bowel movement, usually the day after surgery.  You may have packing if you had an abscess.  Let any packing or gauze fall come out.   °b. Wear an absorbent pad or soft cotton balls in your underwear as needed to catch any drainage and help keep the area  °c. Keep the area clean and dry.  Bathe / shower every day.  Keep the area clean by showering / bathing over the incision / wound.   It is okay to soak an open wound to help wash it.  Consider using a squeeze bottle filled with warm water to gently wash the anal area.  Wet wipes or showers / gentle washing after bowel movements is often less traumatic than regular toilet paper. °d. You will often notice bleeding with bowel movements.  This should slow down by the end of the first week of surgery.  Sitting on an ice pack can  help. °e. Expect some drainage.  This should slow down by the end of the first week of surgery, but you will have occasional bleeding or drainage up to a few months after surgery.  Wear an absorbent pad or soft cotton gauze in your underwear until the drainage stops. ° °6. ACTIVITIES as tolerated:   °a. You may resume regular (light) daily activities beginning the next day--such as daily self-care, walking, climbing stairs--gradually increasing activities as tolerated.  If you can walk 30 minutes without difficulty, it is safe to try more intense activity such as jogging, treadmill, bicycling, low-impact aerobics, swimming, etc. °b. Save the most intensive and strenuous activity for last such as sit-ups, heavy lifting, contact sports, etc  Refrain from any heavy lifting or straining until you are off narcotics for pain control.   °c. DO NOT PUSH THROUGH PAIN.  Let pain   be your guide: If it hurts to do something, don't do it.  Pain is your body warning you to avoid that activity for another week until the pain goes down. d. You may drive when you are no longer taking prescription pain medication, you can comfortably sit for long periods of time, and you can safely maneuver your car and apply brakes. e. Dennis Bast may have sexual intercourse when it is comfortable.  7. FOLLOW UP in our office a. Please call [facility] at [phone] to set up an appointment to see your surgeon in the office for a follow-up appointment approximately 2-3 weeks after your surgery. b. Make sure that you call for this appointment the day you arrive home to ensure a convenient appointment time.  8. IF YOU HAVE DISABILITY OR FAMILY LEAVE FORMS, BRING THEM TO THE OFFICE FOR PROCESSING.  DO NOT GIVE THEM TO YOUR DOCTOR.        WHEN TO CALL us [phone]: 1. Poor pain control 2. Reactions / problems with new medications (rash/itching, nausea, etc)  3. Fever over 101.5 F (38.5 C) 4. Inability to urinate 5. Nausea and/or  vomiting 6. Worsening swelling or bruising 7. Continued bleeding from incision. 8. Increased pain, redness, or drainage from the incision  The clinic staff is available to answer your questions during regular business hours (8:30am-5pm).  Please dont hesitate to call and ask to speak to one of our nurses for clinical concerns.   A surgeon from [facility] is always on call at the hospitals   If you have a medical emergency, go to the nearest emergency room or call 911.    [facility], [address], [place], [place], [place]  [id number] ? MAIN: [phone] ? TOLL FREE: [phone] ? FAX [phone] [email or web address]     Anal Fistula  An anal fistula is a hole that develops between the bowel and the skin near the anus. The anus allows stool (feces) to leave the body. The anus has many tiny glands that make lubricating fluid. Sometimes, these glands become plugged and infected. This can cause a fluid-filled pocket (abscess) to form. An anal fistula often occurs when an abscess becomes infected and then develops into a hole between the bowel and the skin. What are the causes? In most cases, an anal fistula is caused by a past or current buildup of pus around the anus (anal abscess). Other causes include:  A complication of surgery.  Injury to the rectum or the area around it.  Using high-energy beams (radiation) to treat the area around the rectum. What increases the risk? You are more likely to develop this condition if you have certain medical conditions or diseases, including:  Chronic inflammatory bowel disease, such as Crohn's disease or ulcerative colitis.  Colon cancer or rectal cancer.  Diverticular disease, such as diverticulitis.  A sexually transmitted infection, or STI, such as gonorrhea, chlamydia, or syphilis.  An infection that is caused by HIV. What are the signs or symptoms? Symptoms of this condition  include:  Throbbing or constant pain that may be worse while you are sitting.  Swelling or irritation around the anus.  Pus or blood from an opening near the anus.  Pain when passing stool.  Fever or chills. How is this diagnosed? This condition is diagnosed based on:  A physical exam. This may include: ? An exam to find the external opening of the fistula. ? An exam with a probe or  scope to help locate the internal opening of the fistula. ? An exam of the rectum with a gloved hand (digital rectal exam).  Imaging tests that use dye to find the exact location and path of the fistula. Tests may include: ? X-rays. ? Ultrasound. ? CT scan. ? MRI.  Other tests to find the cause of the anal fistula. How is this treated? This condition is most commonly treated with surgery. The type of surgery that is used will depend on where the fistula is located and how complex the fistula is. Surgery may include:  A fistulotomy. The whole fistula is opened up, and the contents are drained to promote healing.  Seton placement. A silk string (seton) is placed into the fistula during a fistulotomy. This helps to drain any infection and promote healing.  Advancement flap procedure. Tissue is removed from your rectum or the skin around the anus and attached to the opening of the fistula.  Bioprosthetic plug. A cone-shaped plug is made from your tissue and is used to block the opening of the fistula. Some anal fistulas do not require surgery. A nonsurgical treatment option involves injecting a fibrin glue to seal the fistula. You also may be prescribed an antibiotic medicine to treat any infection. Follow these instructions at home: Medicines  Take over-the-counter and prescription medicines only as told by your health care provider.  If you were prescribed an antibiotic medicine, take it as told by your health care provider. Do not stop taking the antibiotic even if you start to feel better.  Use  a stool softener or a laxative if told to do so by your health care provider. General instructions   Eat a high-fiber diet as told by your health care provider. This can help to prevent constipation.  Drink enough fluid to keep your urine pale yellow.  Take a warm sitz bath for 15-20 minutes, 3-4 times per day, or as told by your health care provider. Sitz baths can ease your pain and discomfort and help with healing.  Follow good hygiene to keep the anal area as clean and dry as possible. Use wet toilet paper or a moist towelette after each bowel movement.  Keep all follow-up visits as told by your health care provider. This is important. Contact a health care provider if you have:  Increased pain that is not controlled with medicines.  New redness or swelling around the anal area.  New fluid, blood, or pus coming from the anal area.  Tenderness or warmth around the anal area. Get help right away if you have:  A fever.  Severe pain.  Chills or diarrhea.  Severe problems urinating or having a bowel movement. Summary  An anal fistula is a hole that develops between the bowel and the skin near the anus.  This condition is most often caused by a buildup of pus around the anus (anal abscess). Other causes include a complication of surgery, an injury to the rectum, or the use of radiation to treat the rectal area.  This condition is most commonly treated with surgery.  Follow your health care provider's instructions about taking medicines, eating and drinking, or taking sitz baths.  Call your health care provider if you have more pain, swelling, or blood. Get help right away if you have fever, severe pain, or problems passing urine or stool. This information is not intended to replace advice given to you by your health care provider. Make sure you discuss any questions you have  with your health care provider. Document Released: 07/04/2008 Document Revised: 12/05/2017 Document  Reviewed: 12/05/2017 Elsevier Interactive Patient Education  2019 [employer].

## 2019-01-20 NOTE — Anesthesia Preprocedure Evaluation (Addendum)
Anesthesia Evaluation  Patient identified by MRN, date of birth, ID band Patient awake    Reviewed: Allergy & Precautions, H&P , NPO status , Patient's Chart, lab work & pertinent test results  Airway Mallampati: III  TM Distance: >3 FB Neck ROM: Full    Dental no notable dental hx. (+) Teeth Intact, Dental Advisory Given   Pulmonary sleep apnea and Continuous Positive Airway Pressure Ventilation , former smoker,    Pulmonary exam normal breath sounds clear to auscultation       Cardiovascular negative cardio ROS   Rhythm:Regular Rate:Normal     Neuro/Psych negative neurological ROS  negative psych ROS   GI/Hepatic Neg liver ROS, GERD  Controlled,  Endo/Other  negative endocrine ROS  Renal/GU negative Renal ROS  negative genitourinary   Musculoskeletal   Abdominal   Peds  Hematology negative hematology ROS (+)   Anesthesia Other Findings   Reproductive/Obstetrics negative OB ROS                            Anesthesia Physical Anesthesia Plan  ASA: III  Anesthesia Plan: General   Post-op Pain Management:    Induction: Intravenous  PONV Risk Score and Plan: 3 and Ondansetron, Dexamethasone and Midazolam  Airway Management Planned: Oral ETT  Additional Equipment:   Intra-op Plan:   Post-operative Plan: Extubation in OR  Informed Consent: I have reviewed the patients History and Physical, chart, labs and discussed the procedure including the risks, benefits and alternatives for the proposed anesthesia with the patient or authorized representative who has indicated his/her understanding and acceptance.     Dental advisory given  Plan Discussed with: CRNA  Anesthesia Plan Comments:         Anesthesia Quick Evaluation

## 2019-01-20 NOTE — Anesthesia Postprocedure Evaluation (Signed)
Anesthesia Post Note  Patient: Elijah Robertson  Procedure(s) Performed: [place] Scale UNDER ANESTHESIA (N/A Rectum) INTERNAL FISTULOTOMY WITH SPHINCTEROTOMY  (N/A Rectum)     Patient location during evaluation: PACU Anesthesia Type: General Level of consciousness: awake and alert Pain management: pain level controlled Vital Signs Assessment: post-procedure vital signs reviewed and stable Respiratory status: spontaneous breathing, nonlabored ventilation and respiratory function stable Cardiovascular status: blood pressure returned to baseline and stable Postop Assessment: no apparent nausea or vomiting Anesthetic complications: no    Last Vitals:  Vitals:   01/20/19 1245 01/20/19 1300  BP: 133/85 140/86  Pulse: 76 67  Resp: 13 13  Temp:  36.4 C  SpO2: 93% 92%    Last Pain:  Vitals:   01/20/19 1300  TempSrc:   PainSc: 0-No pain                 Therasa Lorenzi,W. EDMOND

## 2019-01-20 NOTE — Op Note (Signed)
01/20/2019  12:06 PM  PATIENT:  Elijah Robertson  63 y.o. male  Patient Care Team: Billie Ruddy, MD as PCP - General (Family Medicine) Michael Boston, MD as Consulting Physician (General Surgery) Armbruster, Carlota Raspberry, MD as Consulting Physician (Gastroenterology)  PRE-OPERATIVE DIAGNOSIS:  Recurrent perirectal fistula  POST-OPERATIVE DIAGNOSIS:  Recurrent perirectal fistula - intersphincteric  PROCEDURE:  ANALRECTAL EXAM UNDER ANESTHESIA INTERNAL SPHINCTEROTOMY WITH FISTULOTOMY   SURGEON:  Adin Hector, MD  ASSISTANT: OR Staff   ANESTHESIA:   General Anorectal & Local field block (0.25% bupivacaine with epinephrine mixed with Liposomal bupivacaine (Experel)   EBL:  No intake/output data recorded.  Delay start of Pharmacological VTE agent (>24hrs) due to surgical blood loss or risk of bleeding:  no  DRAINS: none   SPECIMEN: Anal fistulous tract  DISPOSITION OF SPECIMEN:  PATHOLOGY  COUNTS:  YES  PLAN OF CARE: Discharge to home after PACU  PATIENT DISPOSITION:  PACU - hemodynamically stable.  INDICATION: Patient with history of recurrent perirectal abscesses and developed fistula.  Underwent ligation of intersphincteric fistulous tract LIFT repair 2017.  Did well but then had recurrent abscess late 2019.  Developed chronic drainage again.  MRI suspicious for intersphincteric fistula.  I recommended examination under anesthesia with possible repeat lift repair versus probable fistulotomy.  Patient has a daughter with Crohn's disease but had negative CT enterography and low suspicion by his gastroenterologist for Crohn's disease.  Always been the solitary location.  The anatomy & physiology of the anorectal region was discussed.  We discussed the pathophysiology of anorectal abscess and fistula.  Differential diagnosis was discussed.  Natural history progression was discussed.   I stressed the importance of a bowel regimen to have daily soft bowel movements to minimize  progression of disease.     The patient's condition is not adequately controlled.  Non-operative treatment has not healed the fistula.  Therefore, I recommended examination under anaesthesia to confirm the diagnosis and treat the fistula.  I discussed techniques that may be required such as fistulotomy, ligation by LIFT technique, and/or seton placement.  Benefits & alternatives discussed.  I noted a good likelihood this will help address the problem, but sometimes repeat operations and prolonged healing times may occur.  Risks such as bleeding, pain, recurrence, reoperation, injury to other organs, need for repair of tissues / organs reoperation, incontinence, heart attack, death, and other risks were discussed.      Educational handouts further explaining the pathology, treatment options, and bowel regimen were given.  The patient expressed understanding & wishes to proceed.  We will work to coordinate surgery for a mutually convenient time.    OR FINDINGS: Patient had a left posterior lateral intersphincteric fistula entering in the left posterior midline anal crypt.    Sphincterotomy location:  Left posteriolateral anal canal.  External sphincter intact  IDESCRIPTION:   Informed consent was confirmed. Patient underwent general anesthesia without difficulty. Patient was placed into  prone positioning.  The perianal region was prepped and draped in sterile fashion. Surgical timeout confirmed or plan.  With evidence of some mild pruritus consistent with chronic drainage.  Had scarring in the left posterior lateral peri-rectal region with sinus opening about 15 mm from the sphincter complex.  I can place a fistula probe into this and find an internal opening at the anal crypts slightly left posterior midline.  Injected the tract with methylene blue.  Patient noted it was going into the sphincter complex but again seemed not through the entire  complex.  Not trans-enteric but intersphincteric as MRI  supposed.  I excised the scarring around the perirectal fistulous opening.  I excised a core cylinder to the came to the sphincter complex.  Carefully freed the fistulous tract off the external sphincter and confirmed that it was intersphincteric.  This left 8 mm to the internal opening.  I went ahead and excised through the internal sphincter to remove the chronic fistulous tract and provide fistulotomy.  And confirmed that the outer sphincter complex was intact and not harmed.  I excised some redundant hemorrhoidal tissue and a more flat open wound.  5 x 3 cm total.  I ran 3-0 chromic from the rectum to the anterior hemi-circumference of the wound I did another separate 1 to the posterior hemi-circumference of the wound to provide marsupialization.  Was on a nice flat open wound intact.  I discussed operative findings, updated the patient's status, discussed probable steps to recovery, and gave postoperative recommendations to the Patient's daughter.  Recommendations were made.  Questions were answered.  She expressed understanding & appreciation.     Adin Hector, M.D., F.A.C.S. Gastrointestinal and Minimally Invasive Surgery [facility] [address], [place], [place] [id number] [phone] Main / Paging

## 2019-01-20 NOTE — Interval H&P Note (Signed)
History and Physical Interval Note:  01/20/2019 10:41 AM  Elijah Robertson  has presented today for surgery, with the diagnosis of Anal fistula.  The various methods of treatment have been discussed with the patient and family. After consideration of risks, benefits and other options for treatment, the patient has consented to  Procedure(s): [place] (N/A) POSSIBLE PARTIAL SPHINCTEROTOMY (N/A) REPAIR OF PERIRECTAL FISTULA (N/A) as a surgical intervention.  The patient's history has been reviewed, patient examined, no change in status, stable for surgery.  I have reviewed the patient's chart and labs.  Questions were answered to the patient's satisfaction.     Adin Hector

## 2019-01-20 NOTE — H&P (Signed)
Kunaal Walkins Fukuhara DOB: 04-07-1956 Single / Language: Cleophus Molt / Race: White Male  Patient Care Team: Billie Ruddy, MD as PCP - General (Family Medicine) Michael Boston, MD as Consulting Physician (General Surgery) Armbruster, Carlota Raspberry, MD as Consulting Physician (Gastroenterology)  Marland Kitchen The patient returns s/p repair of Left perirectal intersphincteric fistula repair (LIFT). Excision of polyp. Date of procedure: 05/16/2017 Pathology: Benign fistulous tract. Benign fibroepithelial anal polyp  I&D new left posterior perirectal abscess 05/25/2018  Patient returns with some persistent moisture. He followed up with gastroenterology. CT enterography within normal limits. MRI pelvis confirmed perirectal fistula. Seem solitary and probably involving the sphincter. No undrained collections. No watercan changes. coronary the patient, his gastroenterology doubted that the patient has a diagnosis of Crohn's disease the did discuss the theoretical need for Humira like his daughter with inflammatory bowel disease. Dr. Havery Moros did not wish to repeat colonoscopy. The patient did not want to start any immunosuppression. He returns to consider options. Denies any fevers or chills. He is trying to lose weight but remains overweight. Moving his bowels a few times a day. No severe diarrhea. No rectal bleeding. No urgency.  No new events,  Ready for surgery       Prior Notes: Patient returns today feeling better. Completed antibiotics. Drainage and pain of abscess has gone down. Moving his bowels about twice a day. No fevers chills or sweats. No major bleeding. No new painful areas. Got a call from gastroenterology, wondering if further testing needed.    Is been almost a year since I last saw the patient. He notes that the area had remain healed and done well. However 3 weeks ago, he felt a lump again on his left perianal side. It then spontaneously drained 4  days ago. Some thin pus and blood. Concerned him. Saw gastroenterology the next day. Recurrent abscess/fistula suspected. Placed on oral antibiotics. He comes in the next business day, Monday to see me. He feels like it is draining okay. His bowels are a little more loose on the Cipro and Flagyl antibiotics. Going about 2-3 times a day. Normally moves his bowels once or twice a day. He's been taking his Metamucil. Denies any irregular bowels or straining.  No personal nor family history of GI/colon cancer, irritable bowel syndrome, allergy such as Celiac Sprue, dietary/dairy problems, colitis, ulcers nor gastritis. No recent sick contacts/gastroenteritis. No travel outside the country. No changes in diet. No dysphagia to solids or liquids. No significant heartburn or reflux. No hematochezia, hematemesis, coffee ground emesis. No evidence of prior gastric/peptic ulceration. 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) ENCOUNTER FOR PREOPERATIVE EXAMINATION FOR GENERAL SURGICAL PROCEDURE (Z01.818) PROLAPSED INTERNAL HEMORRHOIDS, GRADE 2 (K64.1) DIASTASIS RECTI (M62.08) INCARCERATED UMBILICAL HERNIA (Y69.4) ANAL POLYP (K62.0) F/U PRN - HISTORY OF RECTAL SURGERY (Z98.890) ANAL ABSCESS (W54.6) UMBILICAL HERNIA WITHOUT OBSTRUCTION AND WITHOUT GANGRENE (K42.9)  Past Surgical History Adin Hector, MD; 09/21/2018 10:22 AM) Colon Polyp Removal - Colonoscopy  Diagnostic Studies History Adin Hector, MD; 09/21/2018 10:22 AM) Colonoscopy 1-5 years ago  Allergies Sabino Gasser, CMA; 09/21/2018 10:05 AM) No Known Drug Allergies [03/06/2017]: Allergies Reconciled  Medication History Sabino Gasser, CMA; 09/21/2018 10:06 AM) Ketoconazole (External) Specific strength unknown - Active. Medications Reconciled Zinc Gluconate (100MG  Tablet,  Oral) Active. Ciprofloxacin HCl (500MG  Tablet, Oral) Active. metroNIDAZOLE (500MG  Tablet, Oral) Active. Sildenafil Citrate (20MG  Tablet, Oral) Active. Aspirin (81MG  Tablet, Oral)  Active.  Social History Adin Hector, MD; 09/21/2018 10:22 AM) Alcohol use Moderate alcohol use. Caffeine use Carbonated beverages, Coffee. Illicit drug use Uses socially only. Tobacco use Former smoker.  Family History Adin Hector, MD; 09/21/2018 10:22 AM) Breast Cancer Mother. Hypertension Father. Ischemic Bowel Disease Daughter.  Other Problems Adin Hector, MD; 09/21/2018 10:22 AM) Back Pain Hemorrhoids Seizure Disorder    Vitals Sabino Gasser CMA; 09/21/2018 10:07 AM) 09/21/2018 10:06 AM Weight: 215.31 lb Height: 69in Body Surface Area: 2.13 m Body Mass Index: 31.8 kg/m  Temp.: 96.78F(Oral)  Pulse: 95 (Regular)  BP: 124/82 (Sitting, Left Arm, Standard)      Physical Exam Adin Hector MD; 09/21/2018 2:19 PM)  General Mental Status-Alert. General Appearance-Not in acute distress. Voice-Normal.  Integumentary Global Assessment Normal Exam - Distribution of scalp and body hair is normal. General Characteristics Overall Skin Surface - no rashes and no suspicious lesions.  Head and Neck Head-normocephalic, atraumatic with no lesions or palpable masses. Face Global Assessment - atraumatic, no absence of expression. Neck Global Assessment - no abnormal movements, no decreased range of motion. Trachea-midline. Thyroid Gland Characteristics - non-tender.  Eye Eyeball - Left-Extraocular movements intact, No Nystagmus. Eyeball - Right-Extraocular movements intact, No Nystagmus. Upper Eyelid - Left-No Cyanotic. Upper Eyelid - Right-No Cyanotic.  Chest and Lung Exam Inspection Accessory muscles - No use of accessory muscles in breathing.  Abdomen Note: Obese but soft. 2 cm mass reduces down to small  umbilical hernia. No guarding. No peritonitis.  Male Genitourinary Note: No hidradenitis. No inguinal hernias. Normal external genitalia. Epididymi, testes, and spermatic cords normal without any masses.  Rectal Note: Left posterior perirectal sinus 2.5 cm from the anal verge. At old scar. I can feel a cord heading to the posterior midline. Suspect internal opening at the anal verge or near a posterior midline distal anal crypt.  Moderate pruritus and moisture. No external hemorrhoids. Normal sphincter tone. No other masses or tumors were concerned. No fissure. No undrained abscess. Grade 1-2 internal hemorrhoids by digital and anoscopic exam. No evidence of any active proctitis nor inflammation.  Peripheral Vascular Upper Extremity Inspection - Left - Not Gangrenous, No Petechiae. Right - Not Gangrenous, No Petechiae.  Neurologic Neurologic evaluation reveals -normal attention span and ability to concentrate, able to name objects and repeat phrases. Appropriate fund of knowledge and normal coordination.  Neuropsychiatric Mental status exam performed with findings of-able to articulate well with normal speech/language, rate, volume and coherence and no evidence of hallucinations, delusions, obsessions or homicidal/suicidal ideation. Orientation-oriented X3.  Musculoskeletal Global Assessment Gait and Station - normal gait and station.  Lymphatic General Lymphatics Description - No Generalized lymphadenopathy.   Results Adin Hector MD; 09/21/2018 2:21 PM) Procedures  Name Value Date Hemorrhoids Procedure Other: Left posterior perirectal sinus 2.5 cm from the anal verge. At old scar. I can feel a cord heading to the posterior midline. Suspect internal opening at the anal verge or near a posterior midline distal anal crypt............Marland KitchenModerate pruritus and I/moister. No external hemorrhoids. Normal sphincter tone. No other masses or  tumors were concerned. No fissure. No undrained abscess. Grade 1-2 internal hemorrhoids by digital and anoscopic exam. No evidence of any active proctitis nor inflammation.  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 type II intersphincteric suspected on MRI. Seems more superficial by my exam  Family history  of daughter with ulcerative colitis but no strong evidence of inflammatory bowel disease by a negative colonoscopy in 2018 along with no concerning areas on CT enterography. No active proctitis right now.  I think he would benefit from reoperation. Can repeat LIFT repair but failure rate will be increased. Consider fistulotomy if this superficial or does not require major sphincterotomy. Last resort or if 2nd recurrence, proceed with chronic seton placement. Discussed with my other colorectal partner, Dr. Marcello Moores, who agrees.  The anatomy & physiology of the anorectal region was discussed. We discussed the pathophysiology of anorectal abscess and fistula. Differential diagnosis was discussed. Natural history progression was discussed. I stressed the importance of a bowel regimen to have daily soft bowel movements to minimize progression of disease.  The patient's condition is not adequately controlled. Non-operative treatment has not healed the fistula. Therefore, I recommended examination under anaesthesia to confirm the diagnosis and treat the fistula. I discussed techniques that may be required such as fistulotomy, ligation by LIFT technique, and/or seton placement. Benefits & alternatives discussed. I noted a good likelihood this will help address the problem, but sometimes repeat operations and prolonged healing times may occur. Risks such as bleeding, pain, recurrence, reoperation, incontinence,  heart attack, death, and other risks were discussed.  Educational handouts further explaining the pathology, treatment options, and bowel regimen were given. The patient expressed understanding & wishes to proceed. We will work to coordinate surgery for a mutually convenient time.   UMBILICAL HERNIA WITHOUT OBSTRUCTION AND WITHOUT GANGRENE (K42.9) Impression: Small umbilical hernia. Reducible. Asymptomatic. Would hold off on any intervention, especially in the setting of infection.   BILATERAL INGUINAL HERNIA WITHOUT OBSTRUCTION OR GANGRENE, RECURRENCE NOT SPECIFIED (K40.20) Impression: Incidental inguinal hernias by CT scan and suspected on exam. Very small asymptomatic. Would hold off on any intervention until fistula has healed and remains so. He is not interested in any surgery there anyway.  Adin Hector, MD, FACS, MASCRS Gastrointestinal and Minimally Invasive Surgery    [address] [place], [place] [address] [phone] Main / Paging [phone] Fax

## 2019-01-21 ENCOUNTER — Encounter (HOSPITAL_COMMUNITY): Payer: Self-pay | Admitting: Surgery

## 2019-01-26 ENCOUNTER — Other Ambulatory Visit: Payer: Self-pay | Admitting: *Deleted

## 2019-01-26 DIAGNOSIS — Z87891 Personal history of nicotine dependence: Secondary | ICD-10-CM

## 2019-01-26 DIAGNOSIS — Z122 Encounter for screening for malignant neoplasm of respiratory organs: Secondary | ICD-10-CM

## 2019-02-25 ENCOUNTER — Telehealth: Payer: Self-pay | Admitting: *Deleted

## 2019-02-25 NOTE — Telephone Encounter (Signed)

## 2019-02-26 ENCOUNTER — Ambulatory Visit (INDEPENDENT_AMBULATORY_CARE_PROVIDER_SITE_OTHER)
Admission: RE | Admit: 2019-02-26 | Discharge: 2019-02-26 | Disposition: A | Discharge status: Home / Self Care | Payer: 59 | Source: Ambulatory Visit | Attending: Acute Care | Admitting: Acute Care

## 2019-02-26 ENCOUNTER — Other Ambulatory Visit: Payer: Self-pay

## 2019-02-26 DIAGNOSIS — Z122 Encounter for screening for malignant neoplasm of respiratory organs: Secondary | ICD-10-CM

## 2019-02-26 DIAGNOSIS — Z87891 Personal history of nicotine dependence: Secondary | ICD-10-CM | POA: Diagnosis not present

## 2019-03-02 ENCOUNTER — Encounter: Payer: Self-pay | Admitting: Acute Care

## 2019-03-07 ENCOUNTER — Encounter: Payer: Self-pay | Admitting: Family Medicine

## 2019-03-08 ENCOUNTER — Telehealth: Payer: Self-pay | Admitting: Acute Care

## 2019-03-08 DIAGNOSIS — Z87891 Personal history of nicotine dependence: Secondary | ICD-10-CM

## 2019-03-08 DIAGNOSIS — Z122 Encounter for screening for malignant neoplasm of respiratory organs: Secondary | ICD-10-CM

## 2019-03-08 NOTE — Telephone Encounter (Signed)
Pt informed of CT results per Sarah Groce, NP.  PT verbalized understanding.  Copy sent to PCP.  Order placed for 1 yr f/u CT.  

## 2019-03-08 NOTE — Telephone Encounter (Signed)
Routing to [employer] as she did try to call pt earlier in regards to results of CT.

## 2019-03-08 NOTE — Telephone Encounter (Signed)
LMTC x 1  

## 2019-03-25 ENCOUNTER — Ambulatory Visit (INDEPENDENT_AMBULATORY_CARE_PROVIDER_SITE_OTHER): Payer: 59 | Admitting: Family Medicine

## 2019-03-25 ENCOUNTER — Encounter: Payer: Self-pay | Admitting: Family Medicine

## 2019-03-25 ENCOUNTER — Other Ambulatory Visit: Payer: Self-pay

## 2019-03-25 VITALS — BP 136/90 | HR 80 | Temp 98.2°F | Wt 213.0 lb

## 2019-03-25 DIAGNOSIS — N521 Erectile dysfunction due to diseases classified elsewhere: Secondary | ICD-10-CM | POA: Diagnosis not present

## 2019-03-25 DIAGNOSIS — Z23 Encounter for immunization: Secondary | ICD-10-CM

## 2019-03-25 DIAGNOSIS — M25532 Pain in left wrist: Secondary | ICD-10-CM

## 2019-03-25 DIAGNOSIS — M25531 Pain in right wrist: Secondary | ICD-10-CM | POA: Diagnosis not present

## 2019-03-25 DIAGNOSIS — Z13220 Encounter for screening for lipoid disorders: Secondary | ICD-10-CM

## 2019-03-25 DIAGNOSIS — Z0000 Encounter for general adult medical examination without abnormal findings: Secondary | ICD-10-CM

## 2019-03-25 DIAGNOSIS — Z125 Encounter for screening for malignant neoplasm of prostate: Secondary | ICD-10-CM

## 2019-03-25 DIAGNOSIS — R030 Elevated blood-pressure reading, without diagnosis of hypertension: Secondary | ICD-10-CM

## 2019-03-25 MED ORDER — SILDENAFIL CITRATE 20 MG PO TABS: ORAL_TABLET | ORAL | 1 refills | Status: AC

## 2019-03-25 NOTE — Progress Notes (Signed)
Subjective:     Elijah Robertson is a 63 y.o. male and is here for a comprehensive physical exam. Pt is not fasting.  The patient reports problems - b/l wrist pain.  Started times several months.  Notes normal breast soreness.  Denies numbness, tingling in hands/fingers.  Will occasionally have numbness in right medial forearm that radiates from right wrist.  Patient wearing wrist splints during the day.  Notes symptoms more since working from home using his laptop.  Patient requesting refill on sildenafil.  Social history: Patient works in Engineer, technical sales.  Patient states he is planning to move back to [place] in the next month and a half.  Patient states his family is in that area.  Social History   Socioeconomic History  . Marital status: Single    Spouse name: Not on file  . Number of children: 3  . Years of education: 18  . Highest education level: Not on file  Occupational History  . Not on file  Social Needs  . Financial resource strain: Not on file  . Food insecurity    Worry: Not on file    Inability: Not on file  . Transportation needs    Medical: Not on file    Non-medical: Not on file  Tobacco Use  . Smoking status: Former Smoker    Packs/day: 1.00    Years: 32.00    Pack years: 32.00    Types: Cigarettes    Quit date: 08/05/2010    Years since quitting: 8.6  . Smokeless tobacco: Never Used  Substance and Sexual Activity  . Alcohol use: Yes    Alcohol/week: 2.0 - 3.0 standard drinks    Types: 2 - 3 Glasses of wine per week  . Drug use: Yes    Types: Marijuana    Comment: 1x every couple of months,FEB 2018. 01-15-2019 LAST USE WAS JANUARY 2020  . Sexual activity: Yes  Lifestyle  . Physical activity    Days per week: Not on file    Minutes per session: Not on file  . Stress: Not on file  Relationships  . Social Herbalist on phone: Not on file    Gets together: Not on file    Attends religious service: Not on file    Active member of club or organization:  Not on file    Attends meetings of clubs or organizations: Not on file    Relationship status: Not on file  . Intimate partner violence    Fear of current or ex partner: Not on file    Emotionally abused: Not on file    Physically abused: Not on file    Forced sexual activity: Not on file  Other Topics Concern  . Not on file  Social History Narrative   Fun/Hobby: Engineer, civil (consulting) sports, hang out with friends; Doctor, general practice; Travel   Health Maintenance  Topic Date Due  . HIV Screening  01/28/1973  . INFLUENZA VACCINE  03/06/2019  . COLONOSCOPY  01/31/2022  . TETANUS/TDAP  11/12/2025  . Hepatitis C Screening  Completed    The following portions of the patient's history were reviewed and updated as appropriate: allergies, current medications, past family history, past medical history, past social history, past surgical history and problem list.  Review of Systems Pertinent items noted in HPI and remainder of comprehensive ROS otherwise negative.   Objective:    BP 136/90 (BP Location: Left Arm, Patient Position: Sitting, Cuff Size: Normal)   Pulse 80  Temp 98.2 F (36.8 C) (Oral)   Wt 213 lb (96.6 kg)   SpO2 98%   BMI 30.56 kg/m  General appearance: alert, cooperative and no distress Head: Normocephalic, without obvious abnormality, atraumatic Eyes: conjunctivae/corneas clear. PERRL, EOM's intact. Fundi benign. Ears: normal TM's and external ear canals both ears Nose: Nares normal. Septum midline. Mucosa normal. No drainage or sinus tenderness. Throat: lips, mucosa, and tongue normal; teeth and gums normal Neck: no adenopathy, no carotid bruit, no JVD, supple, symmetrical, trachea midline and thyroid not enlarged, symmetric, no tenderness/mass/nodules Lungs: clear to auscultation bilaterally Heart: regular rate and rhythm, S1, S2 normal, no murmur, click, rub or gallop Abdomen: soft, non-tender; bowel sounds normal; no masses,  no organomegaly Extremities: extremities normal,  atraumatic, no cyanosis or edema negative Tinel and Phalen's.  No deformities, edema, erythema of bilateral wrist.  Normal grip strength bilaterally. Pulses: 2+ and symmetric Skin: Skin color, texture, turgor normal. No rashes or lesions Lymph nodes: Cervical, supraclavicular, and axillary nodes normal. Neurologic: Alert and oriented X 3, normal strength and tone. Normal symmetric reflexes. Normal coordination and gait    Assessment:    Healthy male exam with b/l wrist pain.     Plan:     Anticipatory guidance given including wearing seatbelts, smoke detectors in the home, increasing physical activity, increasing p.o. intake of water and vegetables. -will order labs.  Pt will return when fasting. -given handouts -influenza given this visit See After Visit Summary for Counseling Recommendations    Wrist pain, b/l -likely 2/2 overuse. -discussed ergonomic modifications to follow-up with home -Given handout -Discussed continuing to wear wrist splints. -Follow-up in the next weeks for continued symptoms  Elevated BP reading -discussed bp elevated several times per chart review. -discussed treatment options, r/b/a. -pt declines medication. -advised to continue lifestyle modifications  F/u prn  Grier Mitts, MD

## 2019-03-25 NOTE — Patient Instructions (Signed)
Preventive Care 40-61 Years Old, Male Preventive care refers to lifestyle choices and visits with your health care provider that can promote health and wellness. This includes:  A yearly physical exam. This is also called an annual well check.  Regular dental and eye exams.  Immunizations.  Screening for certain conditions.  Healthy lifestyle choices, such as eating a healthy diet, getting regular exercise, not using drugs or products that contain nicotine and tobacco, and limiting alcohol use. What can I expect for my preventive care visit? Physical exam Your health care provider will check:  Height and weight. These may be used to calculate body mass index (BMI), which is a measurement that tells if you are at a healthy weight.  Heart rate and blood pressure.  Your skin for abnormal spots. Counseling Your health care provider may ask you questions about:  Alcohol, tobacco, and drug use.  Emotional well-being.  Home and relationship well-being.  Sexual activity.  Eating habits.  Work and work environment. What immunizations do I need?  Influenza (flu) vaccine  This is recommended every year. Tetanus, diphtheria, and pertussis (Tdap) vaccine  You may need a Td booster every 10 years. Varicella (chickenpox) vaccine  You may need this vaccine if you have not already been vaccinated. Zoster (shingles) vaccine  You may need this after age 61. Measles, mumps, and rubella (MMR) vaccine  You may need at least one dose of MMR if you were born in 1957 or later. You may also need a second dose. Pneumococcal conjugate (PCV13) vaccine  You may need this if you have certain conditions and were not previously vaccinated. Pneumococcal polysaccharide (PPSV23) vaccine  You may need one or two doses if you smoke cigarettes or if you have certain conditions. Meningococcal conjugate (MenACWY) vaccine  You may need this if you have certain conditions. Hepatitis A vaccine   You may need this if you have certain conditions or if you travel or work in places where you may be exposed to hepatitis A. Hepatitis B vaccine  You may need this if you have certain conditions or if you travel or work in places where you may be exposed to hepatitis B. Haemophilus influenzae type b (Hib) vaccine  You may need this if you have certain risk factors. Human papillomavirus (HPV) vaccine  If recommended by your health care provider, you may need three doses over 6 months. You may receive vaccines as individual doses or as more than one vaccine together in one shot (combination vaccines). Talk with your health care provider about the risks and benefits of combination vaccines. What tests do I need? Blood tests  Lipid and cholesterol levels. These may be checked every 5 years, or more frequently if you are over 50 years old.  Hepatitis C test.  Hepatitis B test. Screening  Lung cancer screening. You may have this screening every year starting at age 55 if you have a 30-pack-year history of smoking and currently smoke or have quit within the past 15 years.  Prostate cancer screening. Recommendations will vary depending on your family history and other risks.  Colorectal cancer screening. All adults should have this screening starting at age 50 and continuing until age 75. Your health care provider may recommend screening at age 45 if you are at increased risk. You will have tests every 1-10 years, depending on your results and the type of screening test.  Diabetes screening. This is done by checking your blood sugar (glucose) after you have not eaten   for a while (fasting). You may have this done every 1-3 years.  Sexually transmitted disease (STD) testing. Follow these instructions at home: Eating and drinking  Eat a diet that includes fresh fruits and vegetables, whole grains, lean protein, and low-fat dairy products.  Take vitamin and mineral supplements as recommended  by your health care provider.  Do not drink alcohol if your health care provider tells you not to drink.  If you drink alcohol: ? Limit how much you have to 0-2 drinks a day. ? Be aware of how much alcohol is in your drink. In the [place] one drink equals one 12 oz bottle of beer (355 mL), one 5 oz glass of wine (148 mL), or one 1 oz glass of hard liquor (44 mL). Lifestyle  Take daily care of your teeth and gums.  Stay active. Exercise for at least 30 minutes on 5 or more days each week.  Do not use any products that contain nicotine or tobacco, such as cigarettes, e-cigarettes, and chewing tobacco. If you need help quitting, ask your health care provider.  If you are sexually active, practice safe sex. Use a condom or other form of protection to prevent STIs (sexually transmitted infections).  Talk with your health care provider about taking a low-dose aspirin every day starting at age 14. What's next?  Go to your health care provider once a year for a well check visit.  Ask your health care provider how often you should have your eyes and teeth checked.  Stay up to date on all vaccines. This information is not intended to replace advice given to you by your health care provider. Make sure you discuss any questions you have with your health care provider. Document Released: 08/18/2015 Document Revised: 07/16/2018 Document Reviewed: 07/16/2018 Elsevier Patient Education  [place].  Preventing Hypertension Hypertension, commonly called high blood pressure, is when the force of blood pumping through the arteries is too strong. Arteries are blood vessels that carry blood from the heart throughout the body. Over time, hypertension can damage the arteries and decrease blood flow to important parts of the body, including the brain, heart, and kidneys. Often, hypertension does not cause symptoms until blood pressure is very high. For this reason, it is important to have your blood  pressure checked on a regular basis. Hypertension can often be prevented with diet and lifestyle changes. If you already have hypertension, you can control it with diet and lifestyle changes, as well as medicine. What nutrition changes can be made? Maintain a healthy diet. This includes:  Eating less salt (sodium). Ask your health care provider how much sodium is safe for you to have. The general recommendation is to consume less than 1 tsp (2,300 mg) of sodium a day. ? Do not add salt to your food. ? Choose low-sodium options when grocery shopping and eating out.  Limiting fats in your diet. You can do this by eating low-fat or fat-free dairy products and by eating less red meat.  Eating more fruits, vegetables, and whole grains. Make a goal to eat: ? 1-2 cups of fresh fruits and vegetables each day. ? 3-4 servings of whole grains each day.  Avoiding foods and beverages that have added sugars.  Eating fish that contain healthy fats (omega-3 fatty acids), such as mackerel or salmon. If you need help putting together a healthy eating plan, try the DASH diet. This diet is high in fruits, vegetables, and whole grains. It is low in sodium,  red meat, and added sugars. DASH stands for Dietary Approaches to Stop Hypertension. What lifestyle changes can be made?   Lose weight if you are overweight. Losing just 3?5% of your body weight can help prevent or control hypertension. ? For example, if your present weight is 200 lb (91 kg), a loss of 3-5% of your weight means losing 6-10 lb (2.7-4.5 kg). ? Ask your health care provider to help you with a diet and exercise plan to safely lose weight.  Get enough exercise. Do at least 150 minutes of moderate-intensity exercise each week. ? You could do this in short exercise sessions several times a day, or you could do longer exercise sessions a few times a week. For example, you could take a brisk 10-minute walk or bike ride, 3 times a day, for 5 days a  week.  Find ways to reduce stress, such as exercising, meditating, listening to music, or taking a yoga class. If you need help reducing stress, ask your health care provider.  Do not smoke. This includes e-cigarettes. Chemicals in tobacco and nicotine products raise your blood pressure each time you smoke. If you need help quitting, ask your health care provider.  Avoid alcohol. If you drink alcohol, limit alcohol intake to no more than 1 drink a day for nonpregnant women and 2 drinks a day for men. One drink equals 12 oz of beer, 5 oz of wine, or 1 oz of hard liquor. Why are these changes important? Diet and lifestyle changes can help you prevent hypertension, and they may make you feel better overall and improve your quality of life. If you have hypertension, making these changes will help you control it and help prevent major complications, such as:  Hardening and narrowing of arteries that supply blood to: ? Your heart. This can cause a heart attack. ? Your brain. This can cause a stroke. ? Your kidneys. This can cause kidney failure.  Stress on your heart muscle, which can cause heart failure. What can I do to lower my risk?  Work with your health care provider to make a hypertension prevention plan that works for you. Follow your plan and keep all follow-up visits as told by your health care provider.  Learn how to check your blood pressure at home. Make sure that you know your personal target blood pressure, as told by your health care provider. How is this treated? In addition to diet and lifestyle changes, your health care provider may recommend medicines to help lower your blood pressure. You may need to try a few different medicines to find what works best for you. You also may need to take more than one medicine. Take over-the-counter and prescription medicines only as told by your health care provider. Where to find support Your health care provider can help you prevent  hypertension and help you keep your blood pressure at a healthy level. Your local hospital or your community may also provide support services and prevention programs. The American Heart Association offers an online support network at: CheapBootlegs.com.cy Where to find more information Learn more about hypertension from:  [place], [facility], and Blood Institute: ElectronicHangman.is  Centers for Disease Control and Prevention: https://ingram.com/  American Academy of Family Physicians: http://familydoctor.org/familydoctor/en/diseases-conditions/high-blood-pressure.printerview.all.html Learn more about the DASH diet from:  [place], Lung, and [place]: https://www.reyes.com/ Contact a health care provider if:  You think you are having a reaction to medicines you have taken.  You have recurrent headaches or feel dizzy.  You  have swelling in your ankles.  You have trouble with your vision. Summary  Hypertension often does not cause any symptoms until blood pressure is very high. It is important to get your blood pressure checked regularly.  Diet and lifestyle changes are the most important steps in preventing hypertension.  By keeping your blood pressure in a healthy range, you can prevent complications like heart attack, heart failure, stroke, and kidney failure.  Work with your health care provider to make a hypertension prevention plan that works for you. This information is not intended to replace advice given to you by your health care provider. Make sure you discuss any questions you have with your health care provider. Document Released: 08/06/2015 Document Revised: 11/13/2018 Document Reviewed: 04/01/2016 Elsevier Patient Education  2020 [place].  Preventing Carpal Tunnel Syndrome  Carpal tunnel syndrome is a condition that causes pain, numbness, and weakness  in the wrist, hand, and fingers. The carpal tunnel is a narrow, hollow space in the wrist. Tendons and one of the main nerves in the hand (median nerve) pass through the carpal tunnel. The median nerve supplies feeling to the thumb and the first three fingers. It also supplies the muscles at the base of the thumb. Carpal tunnel syndrome happens when the median nerve gets squeezed in the area where it passes through the carpal tunnel. In some cases, it may not be possible to prevent carpal tunnel syndrome. However, you can take steps to relieve pressure on your wrist and reduce your risk of developing this condition. How can this condition affect me? Carpal tunnel syndrome can affect your ability to do jobs or activities that involve hand, wrist, and finger action. It can cause symptoms such as:  Pain in the wrist, hand, and fingers.  Burning, tingling, or numbness in the affected area.  A weak feeling in your hands. You may have trouble grabbing and holding items. Symptoms may get worse over time. For some people, symptoms get worse at night. What can increase my risk? The following factors may make you more likely to develop this condition:  Having a job that requires you to repeatedly move your wrist or requires you to use tools that vibrate. This may include jobs that involve using computers, working on an [employer], or working with [place] such as Pension scheme manager.  Being a woman.  Having a family history of the condition.  Having certain conditions, such as: ? Diabetes. ? Pregnancy. ? Obesity. ? Thyroid disease. ? Rheumatoid arthritis. What actions can I take to help prevent this condition?      Avoid making repetitive hand and wrist motions that cause your wrist to get stiff or painful.  Take frequent breaks if you use your hands and wrists for many hours at a time.  Stretch your hands and fingers often to get blood flowing and relieve tension.  Keep your wrists in  the natural position when using a computer keyboard or mouse. Do not bend your wrists downward or sideways.  If you use your hands and wrists for many hours at work, make changes to your work space to ease pressure on your wrists. You may want to use: ? A padded wrist rest for computer work. ? A slanted computer keyboard. ? Hand tools with padded handles to reduce vibrations.  Consider wearing a wrist brace. This will not prevent carpal tunnel syndrome but may keep it from getting worse. A wrist brace reduces bending and stress.  Closely manage any  medical conditions you have that can put you at risk for carpal tunnel syndrome. Have your blood sugar checked to make sure you are not developing diabetes. If you have diabetes, work with your health care provider to keep your blood sugar under control. Where to find more information  [employer] of Neurological Disorders and Stroke: DesMoinesFuneral.dk  [place] of Family Physicians: Patent attorney.org Contact a health care provider if:  You have numbness or tingling in your wrist, hand, or fingers.  You have pain or a burning sensation in your wrist, hand, or fingers.  Pain, tingling, or burning wakes you up at night.  Your hand becomes weak and clumsy.  You frequently drop objects.  You are unable to use your wrists and hands without pain. Summary  Carpal tunnel syndrome is a condition that causes pain, numbness, and weakness in the wrist, hand, and fingers.  You can take steps to relieve pressure on your wrist and reduce your risk of developing this condition.  Avoid making repetitive hand and wrist motions that cause your wrist to get stiff or painful.  If you use your hands and wrists for many hours at work, you may want to make changes to your work space to ease pressure on your wrists.  Take frequent breaks to stretch your hands and fingers. This information is not intended to replace advice given to you by your  health care provider. Make sure you discuss any questions you have with your health care provider. Document Released: 12/04/2017 Document Revised: 12/04/2017 Document Reviewed: 12/04/2017 Elsevier Patient Education  2020 [employer].

## 2019-03-25 NOTE — Addendum Note (Signed)
Addended by: Wyvonne Lenz on: 03/25/2019 03:27 PM   Modules accepted: Orders

## 2019-03-29 ENCOUNTER — Other Ambulatory Visit (INDEPENDENT_AMBULATORY_CARE_PROVIDER_SITE_OTHER): Payer: 59

## 2019-03-29 ENCOUNTER — Other Ambulatory Visit: Payer: Self-pay

## 2019-03-29 DIAGNOSIS — Z125 Encounter for screening for malignant neoplasm of prostate: Secondary | ICD-10-CM | POA: Diagnosis not present

## 2019-03-29 DIAGNOSIS — Z13220 Encounter for screening for lipoid disorders: Secondary | ICD-10-CM

## 2019-03-29 DIAGNOSIS — M25531 Pain in right wrist: Secondary | ICD-10-CM | POA: Diagnosis not present

## 2019-03-29 DIAGNOSIS — R030 Elevated blood-pressure reading, without diagnosis of hypertension: Secondary | ICD-10-CM

## 2019-03-29 DIAGNOSIS — M25532 Pain in left wrist: Secondary | ICD-10-CM | POA: Diagnosis not present

## 2019-03-29 LAB — LIPID PANEL
Cholesterol: 231 mg/dL — ABNORMAL HIGH (ref 0–200)
HDL: 48.8 mg/dL (ref >39.00)
NonHDL: 181.76
Total CHOL/HDL Ratio: 5
Triglycerides: 248 mg/dL — ABNORMAL HIGH (ref 0.0–149.0)
VLDL: 49.6 mg/dL — ABNORMAL HIGH (ref 0.0–40.0)

## 2019-03-29 LAB — CBC WITH DIFFERENTIAL/PLATELET
Basophils Absolute: 0 10*3/uL (ref 0.0–0.1)
Basophils Relative: 0.4 % (ref 0.0–3.0)
Eosinophils Absolute: 0.2 10*3/uL (ref 0.0–0.7)
Eosinophils Relative: 3.5 % (ref 0.0–5.0)
HCT: 40.5 % (ref 39.0–52.0)
Hemoglobin: 13.9 g/dL (ref 13.0–17.0)
Lymphocytes Relative: 41.5 % (ref 12.0–46.0)
Lymphs Abs: 2.3 10*3/uL (ref 0.7–4.0)
MCHC: 34.3 g/dL (ref 30.0–36.0)
MCV: 95.8 fl (ref 78.0–100.0)
Monocytes Absolute: 0.4 10*3/uL (ref 0.1–1.0)
Monocytes Relative: 7.9 % (ref 3.0–12.0)
Neutro Abs: 2.6 10*3/uL (ref 1.4–7.7)
Neutrophils Relative %: 46.7 % (ref 43.0–77.0)
Platelets: 221 10*3/uL (ref 150.0–400.0)
RBC: 4.23 Mil/uL (ref 4.22–5.81)
RDW: 13.3 % (ref 11.5–15.5)
WBC: 5.5 10*3/uL (ref 4.0–10.5)

## 2019-03-29 LAB — BASIC METABOLIC PANEL
BUN: 19 mg/dL (ref 6–23)
CO2: 27 mEq/L (ref 19–32)
Calcium: 9.2 mg/dL (ref 8.4–10.5)
Chloride: 105 mEq/L (ref 96–112)
Creatinine, Ser: 1.16 mg/dL (ref 0.40–1.50)
GFR: 63.97 mL/min (ref >60.00)
Glucose, Bld: 94 mg/dL (ref 70–99)
Potassium: 4.3 mEq/L (ref 3.5–5.1)
Sodium: 141 mEq/L (ref 135–145)

## 2019-03-29 LAB — LDL CHOLESTEROL, DIRECT: Direct LDL: 141 mg/dL

## 2019-03-30 LAB — PSA: PSA: 5.15 ng/mL — ABNORMAL HIGH (ref 0.10–4.00)

## 2022-02-25 ENCOUNTER — Encounter: Payer: Self-pay | Admitting: Gastroenterology
